# Patient Record
Sex: Female | Born: 1960
Health system: Southern US, Community
[De-identification: ages and names within clinical notes are randomized; demographics above are authoritative.]

## PROBLEM LIST (undated history)

## (undated) DIAGNOSIS — K635 Polyp of colon: Secondary | ICD-10-CM

## (undated) DIAGNOSIS — E785 Hyperlipidemia, unspecified: Secondary | ICD-10-CM

## (undated) DIAGNOSIS — I10 Essential (primary) hypertension: Secondary | ICD-10-CM

## (undated) DIAGNOSIS — D689 Coagulation defect, unspecified: Secondary | ICD-10-CM

## (undated) DIAGNOSIS — R011 Cardiac murmur, unspecified: Secondary | ICD-10-CM

## (undated) DIAGNOSIS — Z86718 Personal history of other venous thrombosis and embolism: Secondary | ICD-10-CM

## (undated) HISTORY — DX: Hyperlipidemia, unspecified: E78.5

## (undated) HISTORY — PX: COLONOSCOPY W/ POLYPECTOMY: SHX1380

## (undated) HISTORY — DX: Essential (primary) hypertension: I10

## (undated) HISTORY — DX: Polyp of colon: K63.5

## (undated) HISTORY — DX: Cardiac murmur, unspecified: R01.1

## (undated) HISTORY — DX: Coagulation defect, unspecified: D68.9

## (undated) HISTORY — DX: Personal history of other venous thrombosis and embolism: Z86.718

## (undated) HISTORY — PX: NO PAST SURGERIES: SHX2092

---

## 1998-08-29 ENCOUNTER — Other Ambulatory Visit: Admission: RE | Admit: 1998-08-29 | Discharge: 1998-08-29 | Payer: Self-pay | Admitting: Obstetrics and Gynecology

## 1999-01-12 ENCOUNTER — Encounter: Payer: Self-pay | Admitting: Emergency Medicine

## 1999-01-12 ENCOUNTER — Emergency Department (HOSPITAL_COMMUNITY): Admission: EM | Admit: 1999-01-12 | Discharge: 1999-01-12 | Payer: Self-pay | Admitting: Emergency Medicine

## 1999-10-07 ENCOUNTER — Other Ambulatory Visit: Admission: RE | Admit: 1999-10-07 | Discharge: 1999-10-07 | Payer: Self-pay | Admitting: Obstetrics and Gynecology

## 2000-12-13 ENCOUNTER — Other Ambulatory Visit: Admission: RE | Admit: 2000-12-13 | Discharge: 2000-12-13 | Payer: Self-pay | Admitting: Obstetrics and Gynecology

## 2001-12-18 ENCOUNTER — Other Ambulatory Visit: Admission: RE | Admit: 2001-12-18 | Discharge: 2001-12-18 | Payer: Self-pay | Admitting: Obstetrics and Gynecology

## 2003-01-03 ENCOUNTER — Other Ambulatory Visit: Admission: RE | Admit: 2003-01-03 | Discharge: 2003-01-03 | Payer: Self-pay | Admitting: Obstetrics and Gynecology

## 2004-03-30 ENCOUNTER — Other Ambulatory Visit: Admission: RE | Admit: 2004-03-30 | Discharge: 2004-03-30 | Payer: Self-pay | Admitting: Obstetrics and Gynecology

## 2005-04-27 ENCOUNTER — Ambulatory Visit: Payer: Self-pay | Admitting: Gastroenterology

## 2005-04-28 ENCOUNTER — Other Ambulatory Visit: Admission: RE | Admit: 2005-04-28 | Discharge: 2005-04-28 | Payer: Self-pay | Admitting: Obstetrics and Gynecology

## 2005-05-11 ENCOUNTER — Ambulatory Visit: Payer: Self-pay | Admitting: Gastroenterology

## 2010-03-18 ENCOUNTER — Encounter (INDEPENDENT_AMBULATORY_CARE_PROVIDER_SITE_OTHER): Payer: Self-pay | Admitting: *Deleted

## 2010-07-19 DIAGNOSIS — Z86718 Personal history of other venous thrombosis and embolism: Secondary | ICD-10-CM

## 2010-07-19 HISTORY — DX: Personal history of other venous thrombosis and embolism: Z86.718

## 2010-08-20 NOTE — Letter (Signed)
Summary: Colonoscopy Letter   Gastroenterology  [address] [place], [place] [address]   Phone: [phone]  Fax: [phone]      March 18, 2010 MRN: [id number]   [facility] [address] [place], [place]  [address]   Dear Ms. Padia,   According to your medical record, it is time for you to schedule a Colonoscopy. The American Cancer Society recommends this procedure as a method to detect early colon cancer. Patients with a family history of colon cancer, or a personal history of colon polyps or inflammatory bowel disease are at increased risk.  This letter has beeen generated based on the recommendations made at the time of your procedure. If you feel that in your particular situation this may no longer apply, please contact our office.  Please call our office at [phone] to schedule this appointment or to update your records at your earliest convenience.  Thank you for cooperating with [place] to provide you with the very best care possible.   Sincerely,  Judie Petit T. Russella Dar, M.D.  [facility] Gastroenterology Division [phone]

## 2010-09-02 ENCOUNTER — Encounter (INDEPENDENT_AMBULATORY_CARE_PROVIDER_SITE_OTHER): Payer: Self-pay | Admitting: *Deleted

## 2010-09-09 NOTE — Letter (Signed)
Summary: Pre Visit Letter Revised  [facility]  [address] [place], [place] [address]   Phone: [phone]  Fax: [phone]        09/02/2010 MRN: [id number] [facility] [address] [place], [place]  [address]             Procedure Date:  10-13-10   Welcome to the Gastroenterology Division at [facility].    You are scheduled to see a nurse for your pre-procedure visit on 09-29-10 at 4:30P.M. on the 3rd floor at [facility] [place], [address]  We ask that you try to arrive at our office 15 minutes prior to your appointment time to allow for check-in.  Please take a minute to review the attached form.  If you answer "Yes" to one or more of the questions on the first page, we ask that you call the person listed at your earliest opportunity.  If you answer "No" to all of the questions, please complete the rest of the form and bring it to your appointment.    Your nurse visit will consist of discussing your medical and surgical history, your immediate family medical history, and your medications.   If you are unable to list all of your medications on the form, please bring the medication bottles to your appointment and we will list them.  We will need to be aware of both prescribed and over the counter drugs.  We will need to know exact dosage information as well.    Please be prepared to read and sign documents such as consent forms, a financial agreement, and acknowledgement forms.  If necessary, and with your consent, a friend or relative is welcome to sit-in on the nurse visit with you.  Please bring your insurance card so that we may make a copy of it.  If your insurance requires a referral to see a specialist, please bring your referral form from your primary care physician.  No co-pay is required for this nurse visit.     If you cannot keep your appointment, please call [phone] to cancel or reschedule prior to your appointment date.  This  allows [place] the opportunity to schedule an appointment for another patient in need of care.    Thank you for choosing [facility] for your medical needs.  We appreciate the opportunity to care for you.  Please visit [place] at our website  to learn more about our practice.  Sincerely, The Gastroenterology Division

## 2010-09-29 ENCOUNTER — Encounter: Payer: Self-pay | Admitting: Gastroenterology

## 2010-10-06 NOTE — Letter (Signed)
Summary: [facility] Instructions  [facility]  [address] [place], [place] [address]   Phone: [phone]  Fax: [phone]       Renee Swanson    11/25/1960    MRN: [id number]        Procedure Day Dorna Bloom:  Renee Swanson  10/13/10     Arrival Time:  10:30AM     Procedure Time:  11:30AM     Location of Procedure:                    _X_  [facility] (4th Floor)                      PREPARATION FOR COLONOSCOPY WITH MOVIPREP   Starting 5 days prior to your procedure 10/08/10 do not eat nuts, seeds, popcorn, corn, beans, peas,  salads, or any raw vegetables.  Do not take any fiber supplements (e.g. Metamucil, Citrucel, and Benefiber).  THE DAY BEFORE YOUR PROCEDURE         DATE: 10/12/10   DAY: MONDAY  1.  Drink clear liquids the entire day-NO SOLID FOOD  2.  Do not drink anything colored red or purple.  Avoid juices with pulp.  No orange juice.  3.  Drink at least 64 oz. (8 glasses) of fluid/clear liquids during the day to prevent dehydration and help the prep work efficiently.  CLEAR LIQUIDS INCLUDE: Water Jello Ice Popsicles Tea (sugar ok, no milk/cream) Powdered fruit flavored drinks Coffee (sugar ok, no milk/cream) Gatorade Juice: apple, white grape, white cranberry  Lemonade Clear bullion, consomm, broth Carbonated beverages (any kind) Strained chicken noodle soup Hard Candy                             4.  In the morning, mix first dose of MoviPrep solution:    Empty 1 Pouch A and 1 Pouch B into the disposable container    Add lukewarm drinking water to the top line of the container. Mix to dissolve    Refrigerate (mixed solution should be used within 24 hrs)  5.  Begin drinking the prep at 5:00 p.m. The MoviPrep container is divided by 4 marks.   Every 15 minutes drink the solution down to the next mark (approximately 8 oz) until the full liter is complete.   6.  Follow completed prep with 16 oz of clear liquid of your choice (Nothing  red or purple).  Continue to drink clear liquids until bedtime.  7.  Before going to bed, mix second dose of MoviPrep solution:    Empty 1 Pouch A and 1 Pouch B into the disposable container    Add lukewarm drinking water to the top line of the container. Mix to dissolve    Refrigerate  THE DAY OF YOUR PROCEDURE      DATE: 10/13/10   DAY: MONDAY  Beginning at 6:30AM (5 hours before procedure):         1. Every 15 minutes, drink the solution down to the next mark (approx 8 oz) until the full liter is complete.  2. Follow completed prep with 16 oz. of clear liquid of your choice.    3. You may drink clear liquids until 9:30AM (2 HOURS BEFORE PROCEDURE).   MEDICATION INSTRUCTIONS  Unless otherwise instructed, you should take regular prescription medications with a small sip of water   as early as possible the morning of  your procedure.         OTHER INSTRUCTIONS  You will need a responsible adult at least 50 years of age to accompany you and drive you home.   This person must remain in the waiting room during your procedure.  Wear loose fitting clothing that is easily removed.  Leave jewelry and other valuables at home.  However, you may wish to bring a book to read or  an iPod/MP3 player to listen to music as you wait for your procedure to start.  Remove all body piercing jewelry and leave at home.  Total time from sign-in until discharge is approximately 2-3 hours.  You should go home directly after your procedure and rest.  You can resume normal activities the  day after your procedure.  The day of your procedure you should not:   Drive   Make legal decisions   Operate machinery   Drink alcohol   Return to work  You will receive specific instructions about eating, activities and medications before you leave.    The above instructions have been reviewed and explained to me by   Renee Rias RN  September 29, 2010 4:56 PM     I fully understand and can  verbalize these instructions _____________________________ Date _________

## 2010-10-06 NOTE — Miscellaneous (Signed)
Summary: Lec previsit  Clinical Lists Changes  Medications: Added new medication of MOVIPREP 100 GM  SOLR (PEG-KCL-NACL-NASULF-NA ASC-C) As per prep instructions. - Signed Rx of MOVIPREP 100 GM  SOLR (PEG-KCL-NACL-NASULF-NA ASC-C) As per prep instructions.;  #1 x 0;  Signed;  Entered by: Ulis Rias RN;  Authorized by: Meryl Dare MD [place];  Method used: Electronically to [facility] [phone]*, [address], [place], [place], [place]  [address], Ph: [phone] or [phone], Fax: [phone] Observations: Added new observation of NKA: T (09/29/2010 16:18)    Prescriptions: MOVIPREP 100 GM  SOLR (PEG-KCL-NACL-NASULF-NA ASC-C) As per prep instructions.  #1 x 0   Entered by:   Ulis Rias RN   Authorized by:   Meryl Dare MD [facility]   Signed by:   Ulis Rias RN on 09/29/2010   Method used:   Electronically to        [facility] [phone]* (retail)       [address]       [place], [place]  [address]       Ph: [phone] or [phone]       Fax: [phone]   RxID:   [id number]

## 2010-10-12 ENCOUNTER — Encounter: Payer: Self-pay | Admitting: Gastroenterology

## 2010-10-13 ENCOUNTER — Encounter: Payer: Self-pay | Admitting: Gastroenterology

## 2010-10-13 ENCOUNTER — Ambulatory Visit (AMBULATORY_SURGERY_CENTER): Payer: BC Managed Care – PPO | Admitting: Gastroenterology

## 2010-10-13 VITALS — BP 146/69 | HR 81 | Temp 99.2°F | Resp 18 | Ht 65.0 in | Wt 217.0 lb

## 2010-10-13 DIAGNOSIS — Z800 Family history of malignant neoplasm of digestive organs: Secondary | ICD-10-CM

## 2010-10-13 DIAGNOSIS — Z86010 Personal history of colonic polyps: Secondary | ICD-10-CM

## 2010-10-13 DIAGNOSIS — Z1211 Encounter for screening for malignant neoplasm of colon: Secondary | ICD-10-CM

## 2010-10-13 DIAGNOSIS — D126 Benign neoplasm of colon, unspecified: Secondary | ICD-10-CM

## 2010-10-13 DIAGNOSIS — K635 Polyp of colon: Secondary | ICD-10-CM

## 2010-10-13 NOTE — Patient Instructions (Signed)
Polyps  HOLD Aspirin and ALL Aspirin containing and anti inflammatory products for 2 weeks (until 10-27-10) Await Pathology Results Repeat Exam in 3 years (2015)

## 2010-10-13 NOTE — Progress Notes (Signed)
Pt stated before sedation she was difficult to sedate

## 2010-10-14 ENCOUNTER — Telehealth: Payer: Self-pay | Admitting: *Deleted

## 2010-10-14 NOTE — Telephone Encounter (Signed)
See callback note.

## 2010-10-19 ENCOUNTER — Encounter: Payer: Self-pay | Admitting: Gastroenterology

## 2010-10-20 NOTE — Procedures (Signed)
Summary: Colonoscopy  Patient: Renee Swanson Note: All result statuses are Final unless otherwise noted.  Tests: (1) Colonoscopy (COL)   COL Colonoscopy           DONE     [facility]     [address]     [place], [place]  [address]          COLONOSCOPY PROCEDURE REPORT     PATIENT:  Renee, Swanson  MR#:  [id number]     BIRTHDATE:  [birth year], 49 yrs. old  GENDER:  female     ENDOSCOPIST:  Judie Petit T. Russella Dar, MD, [facility]          PROCEDURE DATE:  10/13/2010     PROCEDURE:  Colonoscopy with snare polypectomy     ASA CLASS:  Class II     INDICATIONS:  1) surveillance and high-risk screening  2) family     history of colon cancer: father age 51.  3) history of     pre-cancerous (adenomatous) colon polyps: 04/2003.     MEDICATIONS:   Fentanyl 100 mcg IV, Versed 12 mg IV     DESCRIPTION OF PROCEDURE:   After the risks benefits and     alternatives of the procedure were thoroughly explained, informed     consent was obtained.  Digital rectal exam was performed and     revealed no abnormalities.   The LB [id number] endoscope     was introduced through the anus and advanced to the cecum, which     was identified by both the appendix and ileocecal valve, without     limitations.  The quality of the prep was excellent, using     MoviPrep.  The instrument was then slowly withdrawn as the colon     was fully examined.     <<PROCEDUREIMAGES>>     FINDINGS:  A sessile polyp was found in the proximal transverse     colon. It was 5 mm in size. Polyp was snared without cautery.     Retrieval was successful. A sessile polyp was found in the distal     transverse colon. It was 10 mm in size. Polyp was snared, then     cauterized with monopolar cautery. Retrieval was successful.     Piecemeal polypectomy. A normal appearing cecum, ileocecal valve,     and appendiceal orifice were identified. The ascending, hepatic     flexure, splenic flexure, descending, sigmoid colon, and  rectum     appeared unremarkable. Retroflexed views in the rectum revealed no     abnormalities.  The time to cecum =  1  minutes. The scope was     then withdrawn (time =  15  min) from the patient and the     procedure completed.          COMPLICATIONS:  None          ENDOSCOPIC IMPRESSION:     1) 5 mm sessile polyp in the proximal transverse colon     2) 10 mm sessile polyp in the distal transverse colon          RECOMMENDATIONS:     1) Hold aspirin, aspirin products, and anti-inflammatory     medication for 2 weeks.     2) Await pathology results     3) Repeat Colonoscopy in 3 years pending pathology review.          Venita Lick. Russella Dar, MD, [facility]  CC:  Richardean Chimera, MD          n.     Rosalie DoctorJudie Petit T. Deslyn Cavenaugh at 10/13/2010 11:45 AM          [place]erson, Jasmine December, [id number]  Note: An exclamation mark (!) indicates a result that was not dispersed into the flowsheet. Document Creation Date: 10/13/2010 11:45 AM _______________________________________________________________________  (1) Order result status: Final Collection or observation date-time: 10/13/2010 11:38 Requested date-time:  Receipt date-time:  Reported date-time:  Referring Physician:   Ordering Physician: Claudette Head [id number]) Specimen Source:  Source: [facility] Order Number: [id number] Lab site:

## 2011-02-01 ENCOUNTER — Encounter (HOSPITAL_BASED_OUTPATIENT_CLINIC_OR_DEPARTMENT_OTHER): Payer: Self-pay | Admitting: *Deleted

## 2011-02-01 ENCOUNTER — Emergency Department (HOSPITAL_BASED_OUTPATIENT_CLINIC_OR_DEPARTMENT_OTHER)
Admission: EM | Admit: 2011-02-01 | Discharge: 2011-02-01 | Disposition: A | Discharge status: Home / Self Care | Payer: BC Managed Care – PPO | Attending: Emergency Medicine | Admitting: Emergency Medicine

## 2011-02-01 ENCOUNTER — Emergency Department (HOSPITAL_BASED_OUTPATIENT_CLINIC_OR_DEPARTMENT_OTHER): Payer: BC Managed Care – PPO

## 2011-02-01 ENCOUNTER — Emergency Department (INDEPENDENT_AMBULATORY_CARE_PROVIDER_SITE_OTHER): Payer: BC Managed Care – PPO

## 2011-02-01 DIAGNOSIS — I824Y9 Acute embolism and thrombosis of unspecified deep veins of unspecified proximal lower extremity: Secondary | ICD-10-CM

## 2011-02-01 DIAGNOSIS — I82409 Acute embolism and thrombosis of unspecified deep veins of unspecified lower extremity: Secondary | ICD-10-CM

## 2011-02-01 DIAGNOSIS — R609 Edema, unspecified: Secondary | ICD-10-CM | POA: Insufficient documentation

## 2011-02-01 DIAGNOSIS — I824Z9 Acute embolism and thrombosis of unspecified deep veins of unspecified distal lower extremity: Secondary | ICD-10-CM | POA: Insufficient documentation

## 2011-02-01 DIAGNOSIS — M79609 Pain in unspecified limb: Secondary | ICD-10-CM | POA: Insufficient documentation

## 2011-02-01 DIAGNOSIS — M7989 Other specified soft tissue disorders: Secondary | ICD-10-CM

## 2011-02-01 LAB — CBC
MCV: 87.8 fL (ref 78.0–100.0)
Platelets: 205 10*3/uL (ref 150–400)
RDW: 12.3 % (ref 11.5–15.5)
WBC: 10.3 10*3/uL (ref 4.0–10.5)

## 2011-02-01 LAB — PROTIME-INR: INR: 1.02 (ref 0.00–1.49)

## 2011-02-01 MED ORDER — WARFARIN SODIUM 5 MG PO TABS: 5.0000 mg | ORAL_TABLET | Freq: Every day | ORAL | Status: DC

## 2011-02-01 MED ORDER — ENOXAPARIN SODIUM 150 MG/ML ~~LOC~~ SOLN: 100.0000 mg | Freq: Two times a day (BID) | SUBCUTANEOUS | Status: AC

## 2011-02-01 MED ORDER — ENOXAPARIN SODIUM 100 MG/ML ~~LOC~~ SOLN
1.0000 mg/kg | Freq: Once | SUBCUTANEOUS | Status: AC
  Administered 2011-02-01: 100 mg via SUBCUTANEOUS
  Filled 2011-02-01: qty 1

## 2011-02-01 NOTE — ED Notes (Signed)
Pt c/o right leg cramping x 3 days. Sent here from PMD office for eval of ? DVT

## 2011-02-01 NOTE — ED Notes (Signed)
Pt educated on importance of f/u with an established PMD to have levels checked and to check for resolve of blood clot. Pt verbalized understanding. Pt also demonstrated correct technique of adminstering the lovenox injection, and states she feels comfortable doing so at home. Aware to f/u for worsening symptoms.

## 2011-02-01 NOTE — ED Provider Notes (Signed)
History     Chief Complaint  Patient presents with  . Leg Pain   HPI Comments: Patient presents from urgent care with complaint of right lower extremity swelling. She states that this started on Friday, she awoke with these symptoms, and it is gradually worsened. The symptoms are currently moderate, improved slightly when she brings her legs above her heart, and is not associated with chest pain, shortness of breath or fevers. She does use oral contraceptive pills, but has no other risk factors for DVT including no travel, immobilization, trauma, cancer. Was referred to the emergency department for ultrasound window urgent care evaluated her. The patient has a mild ache in this leg but no rashes.  Patient is a 50 y.o. female presenting with leg pain. The history is provided by the patient.  Leg Pain  Pertinent negatives include no numbness.    Past Medical History  Diagnosis Date  . Colon polyps     history of colon polyps    History reviewed. No pertinent past surgical history.  Family History  Problem Relation Age of Onset  . Colon cancer Father     History  Substance Use Topics  . Smoking status: Never Smoker   . Smokeless tobacco: Not on file  . Alcohol Use: Not on file    OB History    Grav Para Term Preterm Abortions TAB SAB Ect Mult Living                  Review of Systems  Constitutional: Negative for fever and chills.  HENT: Negative for sore throat and neck pain.   Eyes: Negative for visual disturbance.  Respiratory: Negative for cough and shortness of breath.   Cardiovascular: Negative for chest pain.  Gastrointestinal: Negative for nausea, vomiting, abdominal pain and diarrhea.  Genitourinary: Negative for dysuria and frequency.  Musculoskeletal: Negative for back pain.       Edema  Skin: Negative for rash.  Neurological: Negative for weakness, numbness and headaches.  Hematological: Negative for adenopathy.  Psychiatric/Behavioral: Negative for  behavioral problems.    Physical Exam  BP 143/74  Pulse 81  Temp 98.5 F (36.9 C)  Resp 16  Wt 210 lb (95.255 kg)  SpO2 100%  LMP 01/18/2011  Physical Exam  Constitutional: She appears well-developed and well-nourished. No distress.  HENT:  Head: Normocephalic and atraumatic.  Mouth/Throat: Oropharynx is clear and moist. No oropharyngeal exudate.  Eyes: Conjunctivae and EOM are normal. Pupils are equal, round, and reactive to light. Right eye exhibits no discharge. Left eye exhibits no discharge. No scleral icterus.  Neck: Normal range of motion. Neck supple. No JVD present. No thyromegaly present.  Cardiovascular: Normal rate, regular rhythm, normal heart sounds and intact distal pulses.  Exam reveals no gallop and no friction rub.   No murmur heard. Pulmonary/Chest: Effort normal and breath sounds normal. No respiratory distress. She has no wheezes. She has no rales.  Abdominal: Soft. Bowel sounds are normal. She exhibits no distension and no mass. There is no tenderness.  Musculoskeletal: Normal range of motion. She exhibits edema. She exhibits no tenderness.       Right lower extremity much larger than left lower extremity below the knee. Positive edema with pitting. Minimal edema of the foot, normal pulses of the feet bilaterally, normal sensation.  Lymphadenopathy:    She has no cervical adenopathy.  Neurological: She is alert. Coordination normal.  Skin: Skin is warm and dry. No rash noted. She is not diaphoretic. No  erythema.  Psychiatric: She has a normal mood and affect. Her behavior is normal.    ED Course  Procedures  MDM Patient has asymmetry of the legs and is on oral contraceptive pills consistent with possible DVT. Ultrasound ordered.  Ultrasound positive for deep venous thrombosis. Lovenox given in the emergency department. Will discharge with Lovenox and Coumadin for the next 5 days with close followup with her family doctor. All patients and families  questions have been answered    Vida Roller, MD 02/01/11 2231

## 2011-02-01 NOTE — ED Notes (Signed)
Pt reports swelling/pain to RLE x3 days. Was seen at [facility] today and was advised to f/u at a facility to have a doppler study. Dorsiflexion does not seem to worsen pain. Able to ambulate. No obvious injury, but does not have some swelling, most noticeably to ankle.

## 2011-02-02 ENCOUNTER — Telehealth: Payer: Self-pay | Admitting: Cardiology

## 2011-02-02 NOTE — Telephone Encounter (Signed)
Pt wanted to know if SN is accepting new pts. I advised he is not. She wanted to know recs for a PCP. I transferred the pt to [facility] Primary care. Carron Curie, CMA

## 2011-02-02 NOTE — Telephone Encounter (Signed)
NOTE: BLOOD CLOT IS IN LEG. SPOUSE [name]- DOB [birth year] CAN BE REACHED AT [phone]. Renee Swanson

## 2013-08-10 ENCOUNTER — Encounter: Payer: Self-pay | Admitting: Gastroenterology

## 2013-12-28 ENCOUNTER — Encounter: Payer: Self-pay | Admitting: Gastroenterology

## 2014-02-07 ENCOUNTER — Ambulatory Visit (AMBULATORY_SURGERY_CENTER): Payer: Self-pay | Admitting: *Deleted

## 2014-02-07 VITALS — Ht 65.0 in | Wt 234.2 lb

## 2014-02-07 DIAGNOSIS — Z860100 Personal history of colon polyps, unspecified: Secondary | ICD-10-CM

## 2014-02-07 MED ORDER — MOVIPREP 100 G PO SOLR: ORAL | Status: DC

## 2014-02-07 NOTE — Progress Notes (Signed)
No allergies to eggs or soy. No problems with sedation; never had general anesthesia.  Pt given Emmi instructions for colonoscopy  No oxygen use  No diet drug use

## 2014-02-21 ENCOUNTER — Encounter: Payer: BC Managed Care – PPO | Admitting: Gastroenterology

## 2014-02-22 ENCOUNTER — Ambulatory Visit (AMBULATORY_SURGERY_CENTER): Payer: No Typology Code available for payment source | Admitting: Gastroenterology

## 2014-02-22 ENCOUNTER — Encounter: Payer: Self-pay | Admitting: Gastroenterology

## 2014-02-22 VITALS — BP 130/78 | HR 66 | Temp 98.5°F | Resp 25 | Ht 65.0 in | Wt 234.0 lb

## 2014-02-22 DIAGNOSIS — Z86010 Personal history of colonic polyps: Secondary | ICD-10-CM

## 2014-02-22 MED ORDER — SODIUM CHLORIDE 0.9 % IV SOLN: 500.0000 mL | INTRAVENOUS | Status: DC

## 2014-02-22 NOTE — Patient Instructions (Signed)
YOU HAD AN ENDOSCOPIC PROCEDURE TODAY AT THE [facility]: Refer to the procedure report that was given to you for any specific questions about what was found during the examination.  If the procedure report does not answer your questions, please call your gastroenterologist to clarify.  If you requested that your care partner not be given the details of your procedure findings, then the procedure report has been included in a sealed envelope for you to review at your convenience later.  YOU SHOULD EXPECT: Some feelings of bloating in the abdomen. Passage of more gas than usual.  Walking can help get rid of the air that was put into your GI tract during the procedure and reduce the bloating. If you had a lower endoscopy (such as a colonoscopy or flexible sigmoidoscopy) you may notice spotting of blood in your stool or on the toilet paper. If you underwent a bowel prep for your procedure, then you may not have a normal bowel movement for a few days.  DIET: Your first meal following the procedure should be a light meal and then it is ok to progress to your normal diet.  A half-sandwich or bowl of soup is an example of a good first meal.  Heavy or fried foods are harder to digest and may make you feel nauseous or bloated.  Likewise meals heavy in dairy and vegetables can cause extra gas to form and this can also increase the bloating.  Drink plenty of fluids but you should avoid alcoholic beverages for 24 hours.  ACTIVITY: Your care partner should take you home directly after the procedure.  You should plan to take it easy, moving slowly for the rest of the day.  You can resume normal activity the day after the procedure however you should NOT DRIVE or use heavy machinery for 24 hours (because of the sedation medicines used during the test).    SYMPTOMS TO REPORT IMMEDIATELY: A gastroenterologist can be reached at any hour.  During normal business hours, 8:30 AM to 5:00 PM Monday through Friday,  call [phone].  After hours and on weekends, please call the GI answering service at [phone] who will take a message and have the physician on call contact you.   Following lower endoscopy (colonoscopy or flexible sigmoidoscopy):  Excessive amounts of blood in the stool  Significant tenderness or worsening of abdominal pains  Swelling of the abdomen that is new, acute  Fever of 100F or higher    FOLLOW UP: If any biopsies were taken you will be contacted by phone or by letter within the next 1-3 weeks.  Call your gastroenterologist if you have not heard about the biopsies in 3 weeks.  Our staff will call the home number listed on your records the next business day following your procedure to check on you and address any questions or concerns that you may have at that time regarding the information given to you following your procedure. This is a courtesy call and so if there is no answer at the home number and we have not heard from you through the emergency physician on call, we will assume that you have returned to your regular daily activities without incident.  SIGNATURES/CONFIDENTIALITY: You and/or your care partner have signed paperwork which will be entered into your electronic medical record.  These signatures attest to the fact that that the information above on your After Visit Summary has been reviewed and is understood.  Full responsibility of the confidentiality   of this discharge information lies with you and/or your care-partner.   INFORMATION ON HEMORRHOIDS GIVEN TO YOU TODAY

## 2014-02-22 NOTE — Op Note (Signed)
[place]  [address] [place], [id number]   COLONOSCOPY PROCEDURE REPORT  PATIENT: Renee Swanson, Renee Swanson  MR#: [id number] BIRTHDATE: 10-Jul-1961 , 52  yrs. old GENDER: Female ENDOSCOPIST: Ladene Artist, MD, [facility] PROCEDURE DATE:  02/22/2014 PROCEDURE:   Colonoscopy, surveillance First Screening Colonoscopy - Avg.  risk and is 50 yrs.  old or older - No.  Prior Negative Screening - Now for repeat screening. N/A  History of Adenoma - Now for follow-up colonoscopy & has been > or = to 3 yrs.  Yes hx of adenoma.  Has been 3 or more years since last colonoscopy.  Polyps Removed Today? No.  Recommend repeat exam, <10 yrs? Yes.  High risk (family or personal hx). ASA CLASS:   Class II INDICATIONS:Patient's personal history of tubulovillous adenomatous colon polyps. MEDICATIONS: MAC sedation, administered by CRNA and propofol (Diprivan) 300mg  IV DESCRIPTION OF PROCEDURE:   After the risks benefits and alternatives of the procedure were thoroughly explained, informed consent was obtained.  A digital rectal exam revealed no abnormalities of the rectum.   The LB [id number]  endoscope was introduced through the anus and advanced to the cecum, which was identified by both the appendix and ileocecal valve. No adverse events experienced.   The quality of the prep was excellent, using MoviPrep  The instrument was then slowly withdrawn as the colon was fully examined.  COLON FINDINGS: A normal appearing cecum, ileocecal valve, and appendiceal orifice were identified.  The ascending, hepatic flexure, transverse, splenic flexure, descending, sigmoid colon and rectum appeared unremarkable.  No polyps or cancers were seen. Retroflexed views revealed small internal hemorrhoids. The time to cecum=0 minutes 24 seconds.  Withdrawal time=9 minutes 00 seconds. The scope was withdrawn and the procedure completed.  COMPLICATIONS: There were no complications.  ENDOSCOPIC  IMPRESSION: 1.  Normal colon 2.  Small internal hemorrhoids  RECOMMENDATIONS: 1.  Repeat Colonoscopy in 3 years.  eSigned:  Ladene Artist, MD, [facility] 02/22/2014 8:41 AM   cc: Foye Deer, MD

## 2014-02-25 ENCOUNTER — Telehealth: Payer: Self-pay | Admitting: *Deleted

## 2014-02-25 NOTE — Telephone Encounter (Signed)
  Follow up Call-  Call back number 02/22/2014  Post procedure Call Back phone  # [phone]  Permission to leave phone message No     Patient questions:  Do you have a fever, pain , or abdominal swelling? No. Pain Score  0 *  Have you tolerated food without any problems? Yes.    Have you been able to return to your normal activities? Yes.    Do you have any questions about your discharge instructions: Diet   No. Medications  No. Follow up visit  No.  Do you have questions or concerns about your Care? No.  Actions: * If pain score is 4 or above: No action needed, pain <4.

## 2014-05-20 ENCOUNTER — Other Ambulatory Visit: Payer: Self-pay | Admitting: Obstetrics and Gynecology

## 2014-05-21 LAB — CYTOLOGY - PAP

## 2015-01-08 ENCOUNTER — Encounter: Payer: Self-pay | Admitting: Gastroenterology

## 2017-01-04 ENCOUNTER — Encounter: Payer: Self-pay | Admitting: Gastroenterology

## 2017-02-08 ENCOUNTER — Encounter: Payer: Self-pay | Admitting: Gastroenterology

## 2017-04-06 ENCOUNTER — Encounter: Payer: No Typology Code available for payment source | Admitting: Gastroenterology

## 2017-04-14 ENCOUNTER — Ambulatory Visit (AMBULATORY_SURGERY_CENTER): Payer: Self-pay

## 2017-04-14 VITALS — Ht 65.0 in | Wt 238.8 lb

## 2017-04-14 DIAGNOSIS — Z860100 Personal history of colon polyps, unspecified: Secondary | ICD-10-CM

## 2017-04-14 MED ORDER — SUPREP BOWEL PREP KIT 17.5-3.13-1.6 GM/177ML PO SOLN: 1.0000 | Freq: Once | ORAL | 0 refills | Status: AC

## 2017-04-14 NOTE — Progress Notes (Signed)
No diet meds No home oxygen No past problems with anesthesia No allergies to eggs or soy  Declined emmi 

## 2017-04-15 ENCOUNTER — Encounter: Payer: Self-pay | Admitting: Gastroenterology

## 2017-04-28 ENCOUNTER — Ambulatory Visit (AMBULATORY_SURGERY_CENTER): Payer: 59 | Admitting: Gastroenterology

## 2017-04-28 ENCOUNTER — Encounter: Payer: Self-pay | Admitting: Gastroenterology

## 2017-04-28 VITALS — BP 110/72 | HR 61 | Temp 98.4°F | Resp 11 | Ht 65.0 in | Wt 238.0 lb

## 2017-04-28 DIAGNOSIS — Z86010 Personal history of colonic polyps: Secondary | ICD-10-CM

## 2017-04-28 MED ORDER — SODIUM CHLORIDE 0.9 % IV SOLN: 500.0000 mL | INTRAVENOUS | Status: DC

## 2017-04-28 NOTE — Patient Instructions (Signed)
YOU HAD AN ENDOSCOPIC PROCEDURE TODAY AT [facility]:   Refer to the procedure report that was given to you for any specific questions about what was found during the examination.  If the procedure report does not answer your questions, please call your gastroenterologist to clarify.  If you requested that your care partner not be given the details of your procedure findings, then the procedure report has been included in a sealed envelope for you to review at your convenience later.  YOU SHOULD EXPECT: Some feelings of bloating in the abdomen. Passage of more gas than usual.  Walking can help get rid of the air that was put into your GI tract during the procedure and reduce the bloating. If you had a lower endoscopy (such as a colonoscopy or flexible sigmoidoscopy) you may notice spotting of blood in your stool or on the toilet paper. If you underwent a bowel prep for your procedure, you may not have a normal bowel movement for a few days.  Please Note:  You might notice some irritation and congestion in your nose or some drainage.  This is from the oxygen used during your procedure.  There is no need for concern and it should clear up in a day or so.  SYMPTOMS TO REPORT IMMEDIATELY:   Following lower endoscopy (colonoscopy or flexible sigmoidoscopy):  Excessive amounts of blood in the stool  Significant tenderness or worsening of abdominal pains  Swelling of the abdomen that is new, acute  Fever of 100F or higher   For urgent or emergent issues, a gastroenterologist can be reached at any hour by calling [phone].   DIET:  We do recommend a small meal at first, but then you may proceed to your regular diet.  Drink plenty of fluids but you should avoid alcoholic beverages for 24 hours.  ACTIVITY:  You should plan to take it easy for the rest of today and you should NOT DRIVE or use heavy machinery until tomorrow (because of the sedation medicines used during the test).     FOLLOW UP: Our staff will call the number listed on your records the next business day following your procedure to check on you and address any questions or concerns that you may have regarding the information given to you following your procedure. If we do not reach you, we will leave a message.  However, if you are feeling well and you are not experiencing any problems, there is no need to return our call.  We will assume that you have returned to your regular daily activities without incident.  If any biopsies were taken you will be contacted by phone or by letter within the next 1-3 weeks.  Please call us at [phone] if you have not heard about the biopsies in 3 weeks.    SIGNATURES/CONFIDENTIALITY: You and/or your care partner have signed paperwork which will be entered into your electronic medical record.  These signatures attest to the fact that that the information above on your After Visit Summary has been reviewed and is understood.  Full responsibility of the confidentiality of this discharge information lies with you and/or your care-partner.  Recall colonoscopy in 5 years.

## 2017-04-28 NOTE — Op Note (Signed)
[facility] Patient Name: Renee Swanson Procedure Date: 04/28/2017 9:09 AM MRN: [id number] Endoscopist: Ladene Artist , MD Age: 56 Referring MD:  Date of Birth: 09/24/1960 Gender: Female Account #: [id number] Procedure:                Colonoscopy Indications:              Surveillance: Personal history of adenomatous                            polyps on last colonoscopy 3 years ago Medicines:                Monitored Anesthesia Care Procedure:                Pre-Anesthesia Assessment:                           - Prior to the procedure, a History and Physical                            was performed, and patient medications and                            allergies were reviewed. The patient's tolerance of                            previous anesthesia was also reviewed. The risks                            and benefits of the procedure and the sedation                            options and risks were discussed with the patient.                            All questions were answered, and informed consent                            was obtained. Prior Anticoagulants: The patient has                            taken no previous anticoagulant or antiplatelet                            agents. ASA Grade Assessment: II - A patient with                            mild systemic disease. After reviewing the risks                            and benefits, the patient was deemed in                            satisfactory condition to undergo the procedure.  After obtaining informed consent, the colonoscope                            was passed under direct vision. Throughout the                            procedure, the patient's blood pressure, pulse, and                            oxygen saturations were monitored continuously. The                            Colonoscope was introduced through the anus and                            advanced to the the  cecum, identified by                            appendiceal orifice and ileocecal valve. The                            ileocecal valve, appendiceal orifice, and rectum                            were photographed. The quality of the bowel                            preparation was excellent. The colonoscopy was                            performed without difficulty. The patient tolerated                            the procedure well. Scope In: 9:12:41 AM Scope Out: 9:24:31 AM Scope Withdrawal Time: 0 hours 10 minutes 39 seconds  Total Procedure Duration: 0 hours 11 minutes 50 seconds  Findings:                 The perianal and digital rectal examinations were                            normal.                           Internal hemorrhoids were found during                            retroflexion. The hemorrhoids were small and Grade                            I (internal hemorrhoids that do not prolapse).                           The exam was otherwise without abnormality on  direct and retroflexion views. Complications:            No immediate complications. Estimated blood loss:                            None. Estimated Blood Loss:     Estimated blood loss: none. Impression:               - Internal hemorrhoids.                           - The examination was otherwise normal on direct                            and retroflexion views.                           - No specimens collected. Recommendation:           - Repeat colonoscopy in 5 years for surveillance.                           - Patient has a contact number available for                            emergencies. The signs and symptoms of potential                            delayed complications were discussed with the                            patient. Return to normal activities tomorrow.                            Written discharge instructions were provided to the                             patient.                           - Resume previous diet.                           - Continue present medications. Ladene Artist, MD 04/28/2017 9:33:19 AM This report has been signed electronically.

## 2017-04-28 NOTE — Progress Notes (Signed)
Report given to PACU, vss 

## 2017-04-28 NOTE — Progress Notes (Signed)
Pt's states no medical or surgical changes since previsit or office visit. 

## 2017-04-29 ENCOUNTER — Telehealth: Payer: Self-pay | Admitting: *Deleted

## 2017-04-29 NOTE — Telephone Encounter (Signed)
  Follow up Call-  Call back number 04/28/2017  Post procedure Call Back phone  # [phone]  Permission to leave phone message Yes  Some recent data might be hidden     Patient questions:  Do you have a fever, pain , or abdominal swelling? No. Pain Score  0 *  Have you tolerated food without any problems? Yes.    Have you been able to return to your normal activities? Yes.    Do you have any questions about your discharge instructions: Diet   No. Medications  No. Follow up visit  No.  Do you have questions or concerns about your Care? No.  Actions: * If pain score is 4 or above: No action needed, pain <4.

## 2019-01-02 ENCOUNTER — Other Ambulatory Visit: Payer: Self-pay

## 2019-01-02 ENCOUNTER — Emergency Department (HOSPITAL_BASED_OUTPATIENT_CLINIC_OR_DEPARTMENT_OTHER)
Admission: EM | Admit: 2019-01-02 | Discharge: 2019-01-02 | Disposition: A | Discharge status: Home / Self Care | Payer: 59 | Attending: Emergency Medicine | Admitting: Emergency Medicine

## 2019-01-02 ENCOUNTER — Encounter (HOSPITAL_BASED_OUTPATIENT_CLINIC_OR_DEPARTMENT_OTHER): Payer: Self-pay | Admitting: *Deleted

## 2019-01-02 ENCOUNTER — Emergency Department (HOSPITAL_BASED_OUTPATIENT_CLINIC_OR_DEPARTMENT_OTHER): Payer: 59

## 2019-01-02 DIAGNOSIS — M79605 Pain in left leg: Secondary | ICD-10-CM

## 2019-01-02 DIAGNOSIS — Z86718 Personal history of other venous thrombosis and embolism: Secondary | ICD-10-CM | POA: Insufficient documentation

## 2019-01-02 LAB — CBC WITH DIFFERENTIAL/PLATELET
Abs Immature Granulocytes: 0.01 10*3/uL (ref 0.00–0.07)
Basophils Absolute: 0 10*3/uL (ref 0.0–0.1)
Basophils Relative: 1 %
Eosinophils Absolute: 0.3 10*3/uL (ref 0.0–0.5)
Eosinophils Relative: 3 %
HCT: 41.6 % (ref 36.0–46.0)
Hemoglobin: 13.8 g/dL (ref 12.0–15.0)
Immature Granulocytes: 0 %
Lymphocytes Relative: 42 %
Lymphs Abs: 3.4 10*3/uL (ref 0.7–4.0)
MCH: 29.7 pg (ref 26.0–34.0)
MCHC: 33.2 g/dL (ref 30.0–36.0)
MCV: 89.5 fL (ref 80.0–100.0)
Monocytes Absolute: 0.6 10*3/uL (ref 0.1–1.0)
Monocytes Relative: 7 %
Neutro Abs: 3.9 10*3/uL (ref 1.7–7.7)
Neutrophils Relative %: 47 %
Platelets: 236 10*3/uL (ref 150–400)
RBC: 4.65 MIL/uL (ref 3.87–5.11)
RDW: 12.3 % (ref 11.5–15.5)
WBC: 8.2 10*3/uL (ref 4.0–10.5)
nRBC: 0 % (ref 0.0–0.2)

## 2019-01-02 LAB — BASIC METABOLIC PANEL
Anion gap: 11 (ref 5–15)
BUN: 13 mg/dL (ref 6–20)
CO2: 25 mmol/L (ref 22–32)
Calcium: 9.2 mg/dL (ref 8.9–10.3)
Chloride: 104 mmol/L (ref 98–111)
Creatinine, Ser: 0.68 mg/dL (ref 0.44–1.00)
GFR calc Af Amer: >60 mL/min (ref >60)
GFR calc non Af Amer: >60 mL/min (ref >60)
Glucose, Bld: 99 mg/dL (ref 70–99)
Potassium: 3.4 mmol/L — ABNORMAL LOW (ref 3.5–5.1)
Sodium: 140 mmol/L (ref 135–145)

## 2019-01-02 MED ORDER — MELOXICAM 7.5 MG PO TABS
7.5000 mg | ORAL_TABLET | Freq: Once | ORAL | Status: AC
  Administered 2019-01-02: 7.5 mg via ORAL
  Filled 2019-01-02: qty 1

## 2019-01-02 MED ORDER — ENOXAPARIN SODIUM 100 MG/ML ~~LOC~~ SOLN
1.0000 mg/kg | Freq: Once | SUBCUTANEOUS | Status: AC
  Administered 2019-01-02: 18:00:00 100 mg via SUBCUTANEOUS
  Filled 2019-01-02: qty 1

## 2019-01-02 MED ORDER — MELOXICAM 7.5 MG PO TABS: 7.5000 mg | ORAL_TABLET | Freq: Every day | ORAL | 0 refills | Status: DC

## 2019-01-02 NOTE — Discharge Instructions (Addendum)
As we discussed, your ultrasound did show extensive arthritis noted in the knee.  This may be contributing to your pain.  Follow the RICE (Rest, Ice, Compression, Elevation) protocol as directed.   Take Mobic as directed.   As we discussed, you have an ultrasound scheduled tomorrow morning.  Please return to main entry of the [facility] for obtaining her ultrasound.  Return the emergency department for any worsening pain, redness or swelling of the leg, breathing, numbness/weakness, vomiting blood, blood in stools,  or any other worsening or concerning symptoms.

## 2019-01-02 NOTE — ED Triage Notes (Signed)
Pt. Reports she has L knee pain just behind the Knee that started on Sunday and has gotten progressively worse and now the entire L knee and around the knee and behind the knee hurts really bad.  Pt. Reports she can hardly stand to put weight on her leg.

## 2019-01-02 NOTE — ED Notes (Signed)
ED Provider at bedside. 

## 2019-01-02 NOTE — ED Provider Notes (Signed)
[facility] EMER[facility]ENCY DEPARTMENT Provider Note   CSN: [id number] Arrival date & time: 01/02/19  1517    History   Chief Complaint Chief Complaint  Patient presents with  . Knee Pain  . Leg Pain    HPI Renee Swanson is a 58 y.o. 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  She reports no preceding trauma, injury or fall.  She does state that prior to onset of symptoms, she had been working in her garden and had been doing some strenuous activity.  She does not recall twisting the knee at all.  Patient states that she has been taking OTC medication with minimal improvement in pain.  She states that pain is worse when she tries to ambulate and bear weight.  She states that it is improved by resting.  She has not noted any overlying warmth, erythema, edema.  Patient has not had any fevers.  She states that she had a history of a DVT in 2012 that was believed to be caused by OCPs.  She is not currently on birth control pills. She denies any OCP use, recent immobilization, prior history of DVT/PE, recent surgery, leg swelling, or long travel.     The history is provided by the patient.    Past Medical History:  Diagnosis Date  . Clotting disorder ([place])    changed BCP and it caused blood clots  . Colon polyps    history of colon polyps  . H/O blood clots 2012   leg  . Heart murmur     There are no active problems to display for this patient.   Past Surgical History:  Procedure Laterality Date  . COLONOSCOPY W/ POLYPECTOMY    . NO PAST SURGERIES       OB History   No obstetric history on file.      Home Medications    Prior to Admission medications   Medication Sig Start Date End Date Taking? Authorizing Provider  aspirin 81 MG tablet Take 81 mg by mouth daily.   Yes [provider]  Cholecalciferol (VITAMIN D PO) Take 1 capsule by mouth daily. 1000 units   Yes [provider]   Cyanocobalamin (VITAMIN B12 PO) Take 1,500 mcg by mouth.   Yes [provider]  hydrochlorothiazide (HYDRODIURIL) 25 MG tablet Take 25 mg by mouth daily.   Yes [provider]  meloxicam (MOBIC) 7.5 MG tablet Take 1 tablet (7.5 mg total) by mouth daily. 01/02/19   Volanda Napoleon, PA-C  Multiple Vitamins-Minerals (CENTRUM PO) Take 1 tablet by mouth daily.     [provider]    Family History Family History  Problem Relation Age of Onset  . Colon cancer Father 81    Social History Social History   Tobacco Use  . Smoking status: Never Smoker  . Smokeless tobacco: Never Used  Substance Use Topics  . Alcohol use: No  . Drug use: No     Allergies   Sulfa antibiotics   Review of Systems Review of Systems  Constitutional: Negative for fever.  Cardiovascular: Negative for leg swelling.  Musculoskeletal:       Left knee pain  Skin: Negative for color change.  All other systems reviewed and are negative.    Physical Exam Updated Vital Signs BP 136/71 (BP Location: Right Arm)   Pulse 74   Temp 98 F (36.7 C)   Resp 16  Ht 5\' 5"  (1.651 m)   Wt 102.1 kg   LMP 01/18/2011   SpO2 100%   BMI 37.44 kg/m   Physical Exam Vitals signs and nursing note reviewed.  Constitutional:      Appearance: Normal appearance. She is well-developed.  HENT:     Head: Normocephalic and atraumatic.  Eyes:     General: Lids are normal.     Conjunctiva/sclera: Conjunctivae normal.     Pupils: Pupils are equal, round, and reactive to light.  Neck:     Musculoskeletal: Full passive range of motion without pain.  Cardiovascular:     Rate and Rhythm: Normal rate and regular rhythm.     Pulses: Normal pulses.          Radial pulses are 2+ on the right side and 2+ on the left side.       Dorsalis pedis pulses are 2+ on the right side and 2+ on the left side.     Heart sounds: Normal heart sounds. No murmur. No friction rub. No gallop.   Pulmonary:     Effort:  Pulmonary effort is normal.     Breath sounds: Normal breath sounds.  Musculoskeletal: Normal range of motion.     Comments: Tenderness palpation noted posterior aspect of left knee.  No overlying warmth, erythema, edema.  No deformity or crepitus noted.  No bony tenderness noted to tib-fib, ankle.  Flexion/extension intact.  Negative posterior and anterior drawer test.  No instability noted on varus or valgus stress.  No tenderness palpation noted to left hip.  No tenderness palpation of the right lower extremity.  Skin:    General: Skin is warm and dry.     Capillary Refill: Capillary refill takes less than 2 seconds.     Comments: Good distal cap refill. LLE is not dusky in appearance or cool to touch.  Neurological:     Mental Status: She is alert and oriented to person, place, and time.  Psychiatric:        Speech: Speech normal.      ED Treatments / Results  Labs (all labs ordered are listed, but only abnormal results are displayed) Labs Reviewed  BASIC METABOLIC PANEL - Abnormal; Notable for the following components:      Result Value   Potassium 3.4 (*)    All other components within normal limits  CBC WITH DIFFERENTIAL/PLATELET    EKG None  Radiology Dg Knee Complete 4 Views Left  Result Date: 01/02/2019 CLINICAL DATA:  Leg pain.  Sudden onset 3 days ago after yard work. EXAM: LEFT KNEE - COMPLETE 4+ VIEW COMPARISON:  None. FINDINGS: No evidence of fracture, dislocation, or joint effusion. There is moderate to marked tricompartment osteoarthritis with joint space narrowing, marginal spur formation and sharpening of the tibial spines. Soft tissues are unremarkable. IMPRESSION: 1. No acute findings. 2. Moderate to marked tricompartment osteoarthritis. Electronically Signed   By: Kerby Moors M.D.   On: 01/02/2019 16:56    Procedures Procedures (including critical care time)  Medications Ordered in ED Medications  enoxaparin (LOVENOX) injection 100 mg (100 mg  Subcutaneous Given 01/02/19 1806)  meloxicam (MOBIC) tablet 7.5 mg (7.5 mg Oral Given 01/02/19 1805)     Initial Impression / Assessment and Plan / ED Course  I have reviewed the triage vital signs and the nursing notes.  Pertinent labs & imaging results that were available during my care of the patient were reviewed by me and considered in my medical decision  making (see chart for details).        57 year old female who presents for evaluation of left knee pain x4 days.  No known trauma, injury, fall.  She does report that she was working in the garden prior to onset of symptoms.  Has not noted any overlying warmth, erythema, edema.  Does state that she has had worsening pain with attempts to ambulate.  She denies any fever, overlying warmth, erythema, edema.  She does have history of DVT in 2012 that they were concerned was caused by OCPs.  She is not currently on any oral birth control pills.  No other PE risk factors.  Given her history, concern for DVT versus musculoskeletal injury vs baker's cyst.  History/physical exam not concerning for acute arterial embolism, septic arthritis, ischemic leg.  Plan for x-ray and ultrasound.  X-ray reviewed.  There is marked tricompartmental arthritis with spurring noted.  Discussed results with patient.  I discussed that this could be contributing to her pain, particularly given history of using and doing strenuous work several days before onset of symptoms.  Additionally, given her history of DVT in the legs, I discussed with her my concern for obtaining ultrasound.  Unfortunately at this time we do not have ultrasound capability at this facility.  We will plan to check basic labs, give her dose of Lovenox and schedule her for an ultrasound appointment tomorrow.  Patient agreeable to plan.  CBC with no leukocytosis or anemia.  BMP is unremarkable.  Discussed results with patient.  Will plan to put patient in a knee sleeve and discharged home on Mobic.   Instructed patient to return tomorrow for her ultrasound for evaluation of possible DVT. At this time, patient exhibits no emergent life-threatening condition that require further evaluation in ED or admission. Patient had ample opportunity for questions and discussion. All patient's questions were answered with full understanding. Strict return precautions discussed. Patient expresses understanding and agreement to plan.   Portions of this note were generated with [employer]. Dictation errors may occur despite best attempts at proofreading.  Final Clinical Impressions(s) / ED Diagnoses   Final diagnoses:  Left leg pain    ED Discharge Orders         Ordered    US Venous Img Lower Unilateral Left     01/02/19 1711    meloxicam (MOBIC) 7.5 MG tablet  Daily     01/02/19 1800           Desma Mcgregor 01/02/19 2052    Virgel Manifold, MD 01/03/19 [phone]

## 2019-01-03 ENCOUNTER — Ambulatory Visit (HOSPITAL_BASED_OUTPATIENT_CLINIC_OR_DEPARTMENT_OTHER)
Admission: RE | Admit: 2019-01-03 | Discharge: 2019-01-03 | Disposition: A | Discharge status: Home / Self Care | Payer: 59 | Source: Ambulatory Visit | Attending: Emergency Medicine | Admitting: Emergency Medicine

## 2019-01-03 DIAGNOSIS — M79605 Pain in left leg: Secondary | ICD-10-CM | POA: Insufficient documentation

## 2019-12-03 IMAGING — DX LEFT KNEE - COMPLETE 4+ VIEW
4 series · 4 of 4 positions shown · non-contrast
Comparison: None.

CLINICAL DATA: Leg pain.  Sudden onset 3 days ago after yard work.

EXAM:
LEFT KNEE - COMPLETE 4+ VIEW

[knee ap]
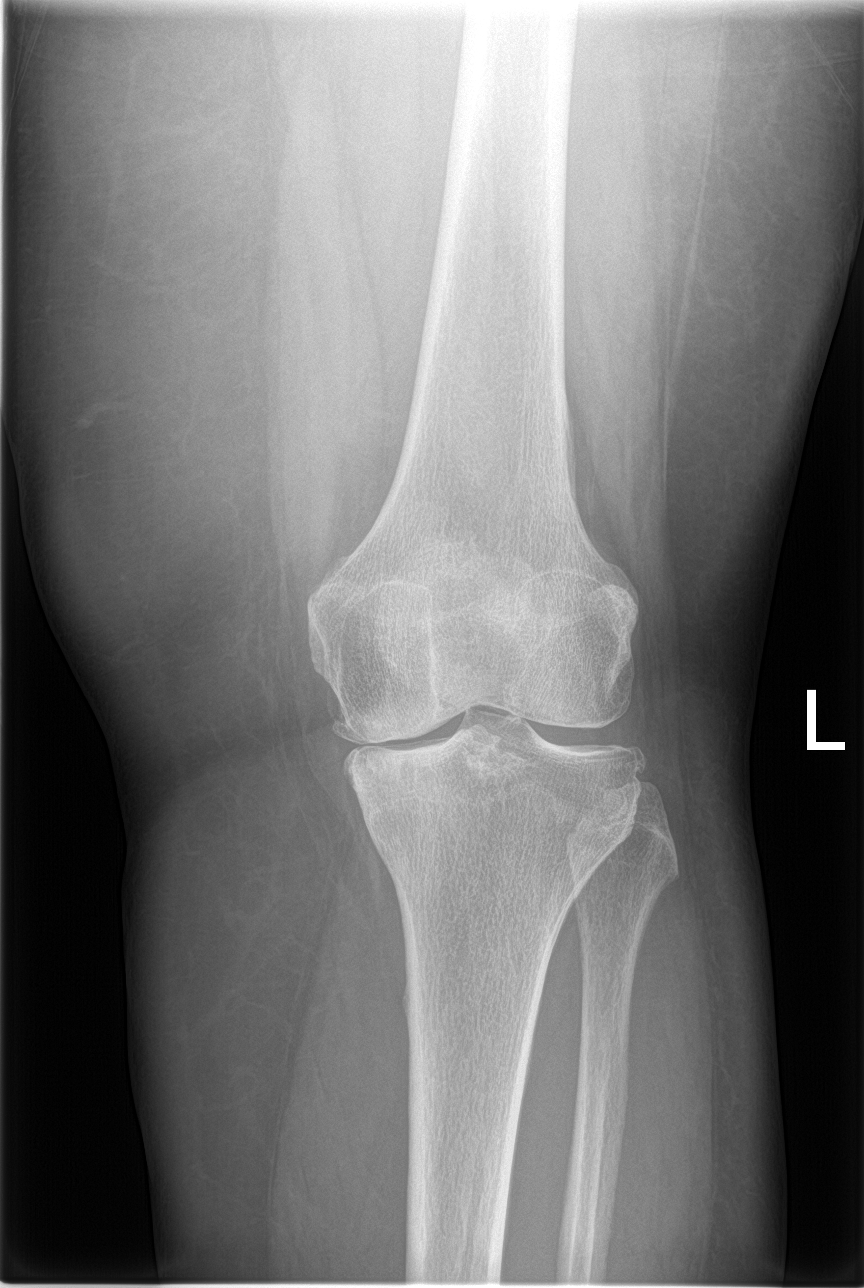

[knee lat]
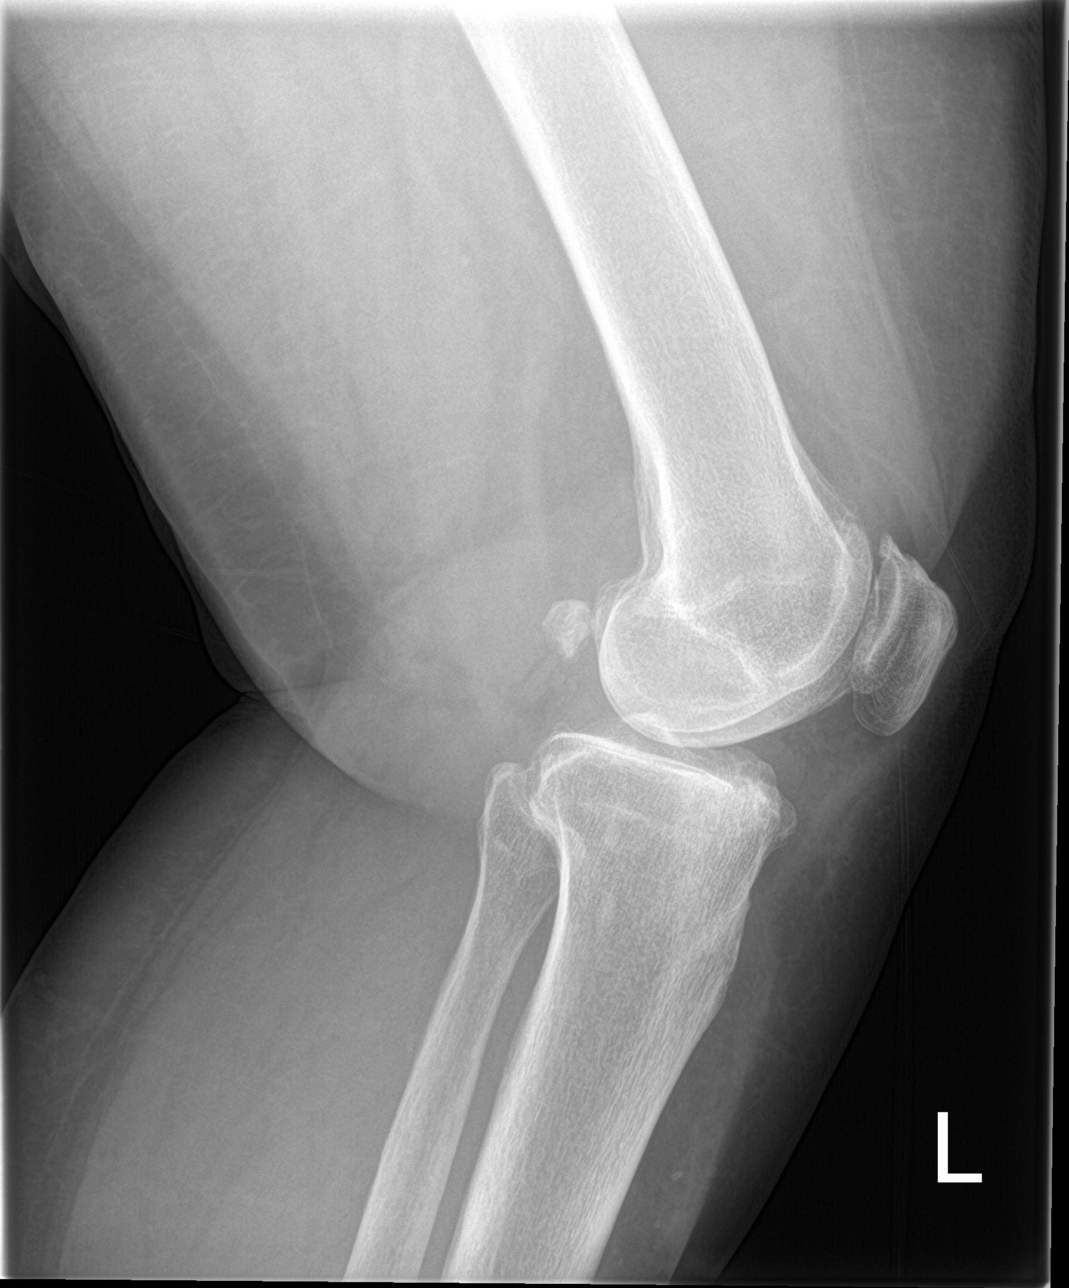

[knee obl (1 of 2)]
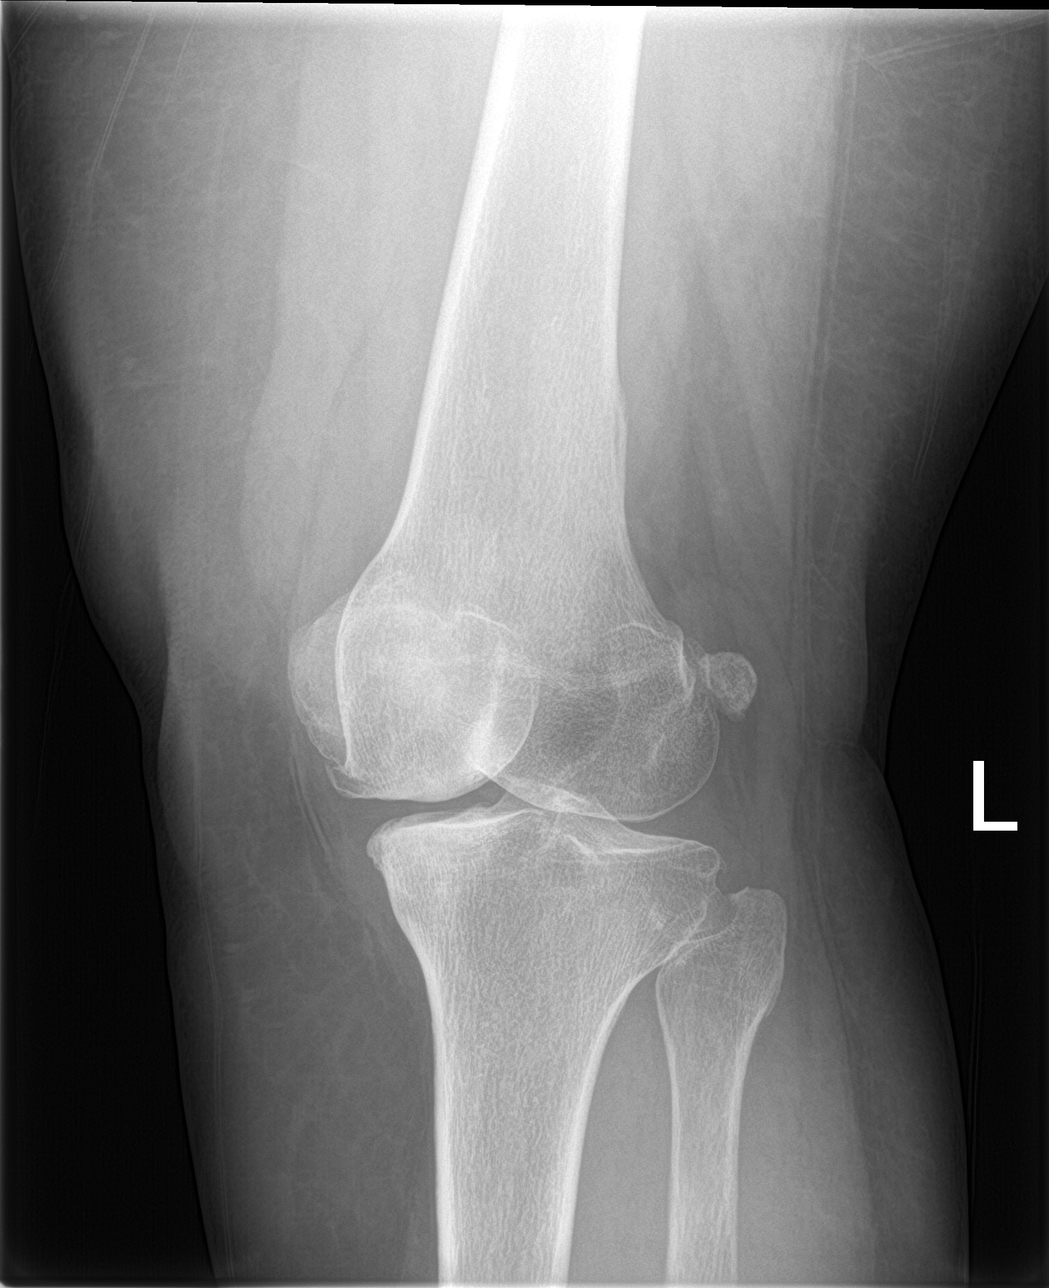

[knee obl (2 of 2)]
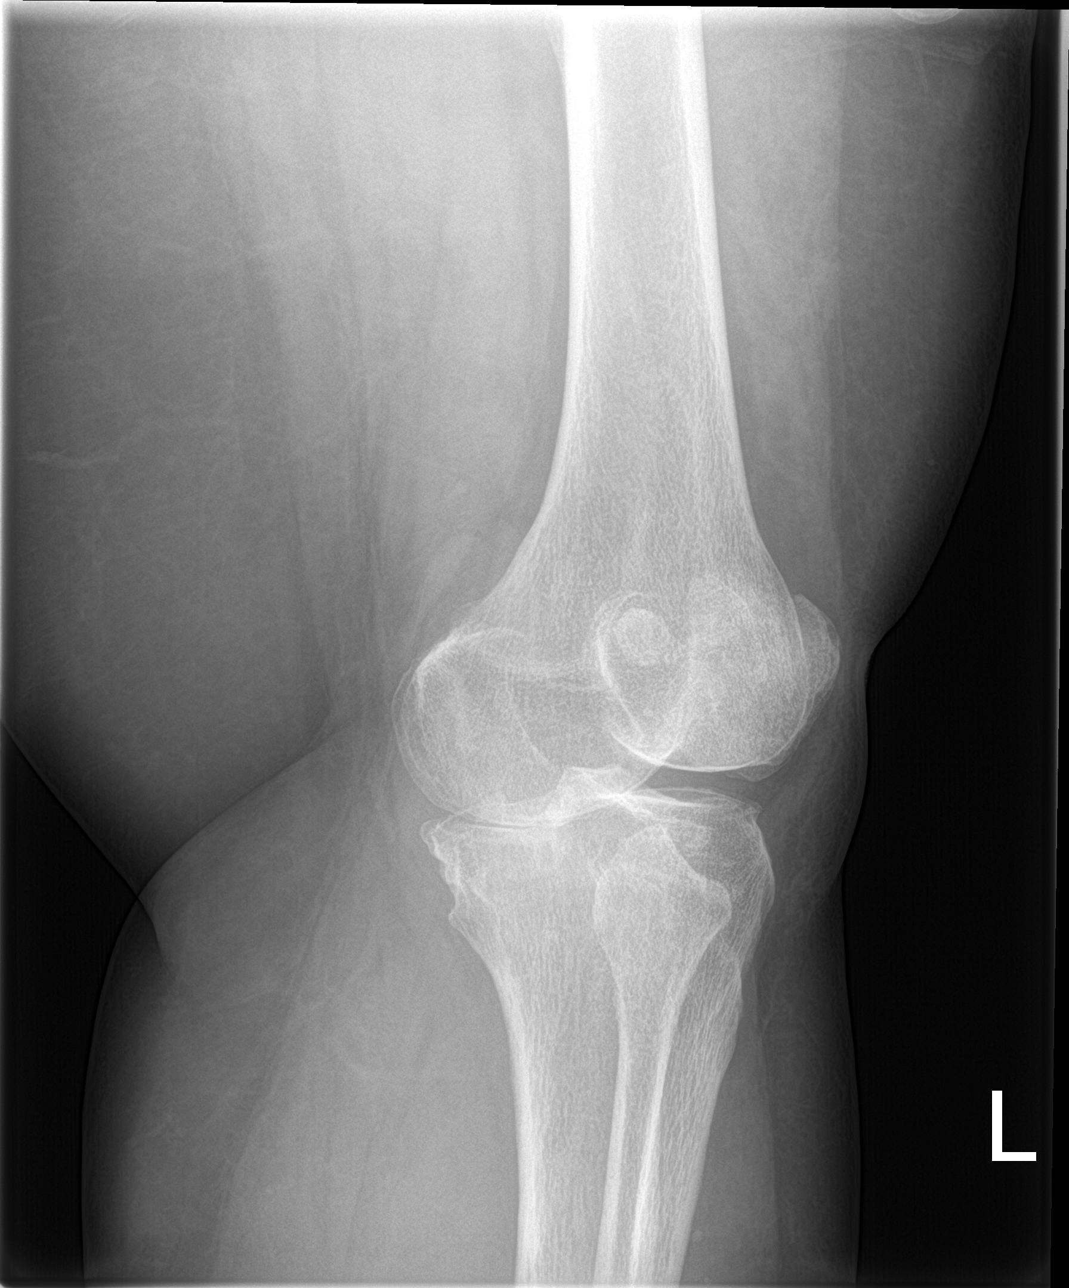

[4 of 4 positions shown; findings below may reference images not displayed]

FINDINGS: No evidence of fracture, dislocation, or joint effusion. There is
moderate to marked tricompartment osteoarthritis with joint space
narrowing, marginal spur formation and sharpening of the tibial
spines. Soft tissues are unremarkable.
IMPRESSION: 1. No acute findings.
2. Moderate to marked tricompartment osteoarthritis.

## 2019-12-04 IMAGING — US VENOUS DOPPLER ULTRASOUND OF LEFT LOWER EXTREMITY
1 series · 13 of 24 positions shown · non-contrast
Comparison: None.

CLINICAL DATA: Left lower extremity pain and edema about the knee



[Series 1: venous doppler ultrasound of left lower extremity · 13 of 36 slices shown]
[im 1/36]
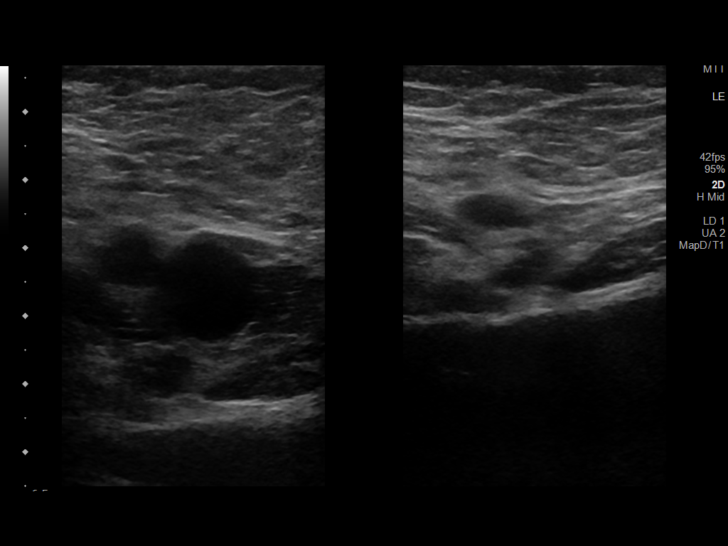
[im 4/36]
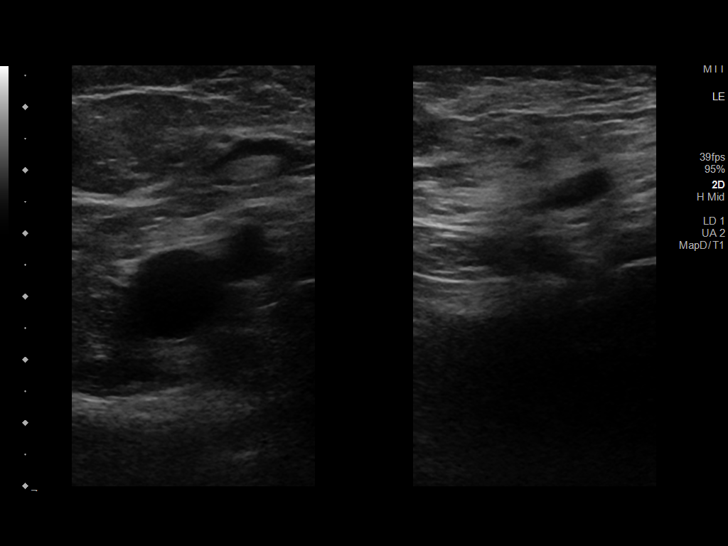
[im 7/36]
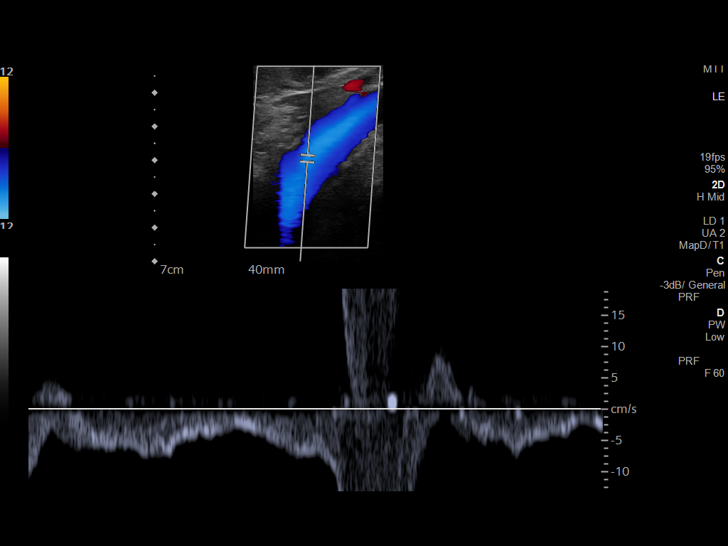
[im 10/36]
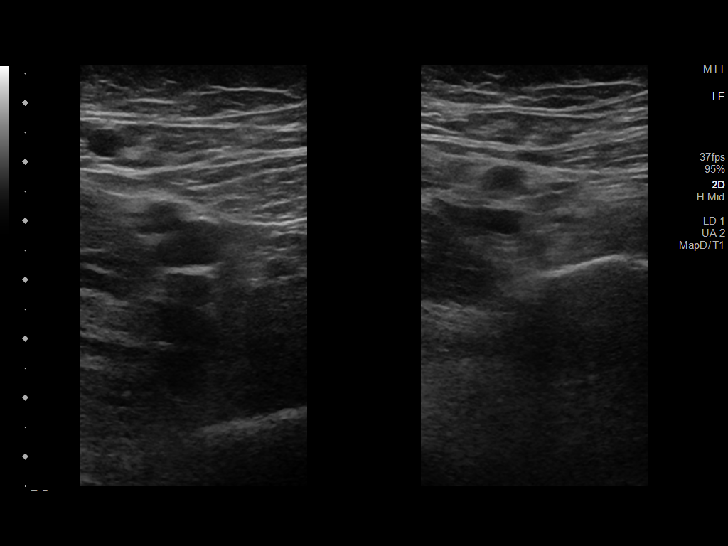
[im 13/36]
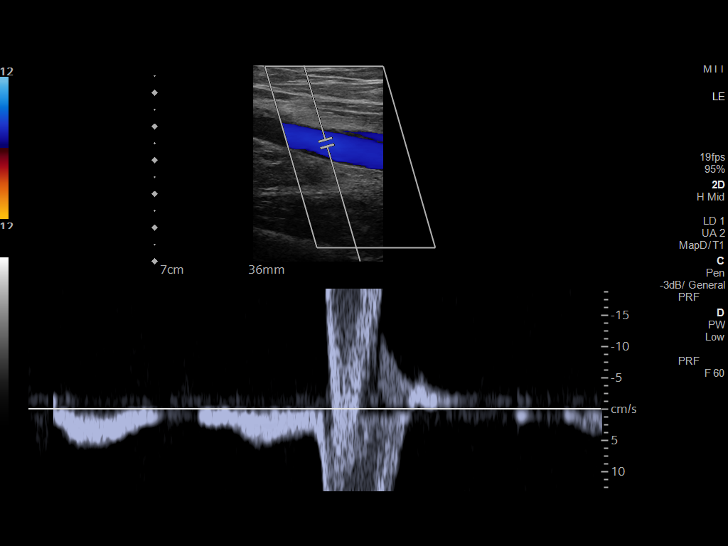
[im 16/36]
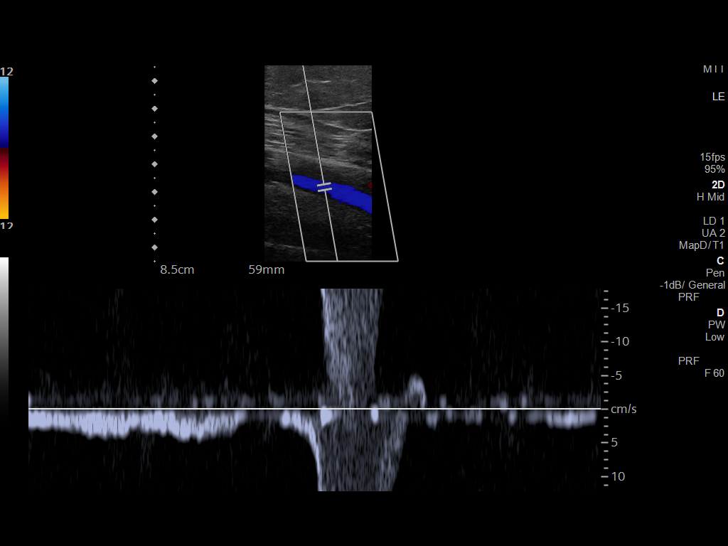
[im 19/36]
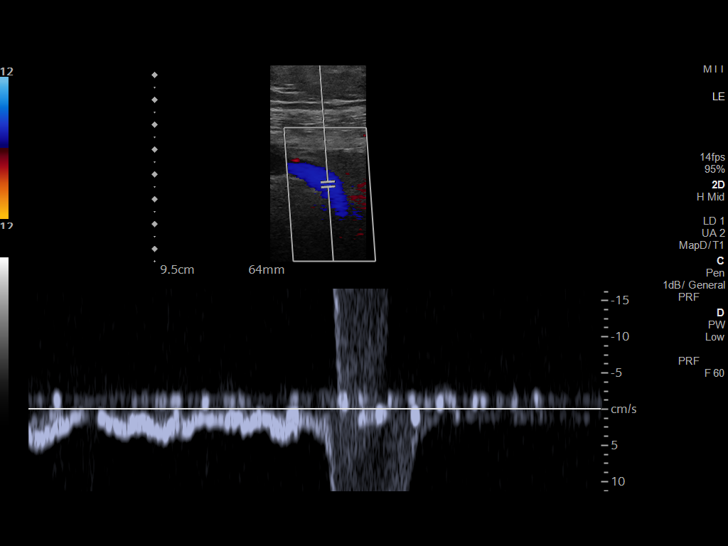
[im 20/36]
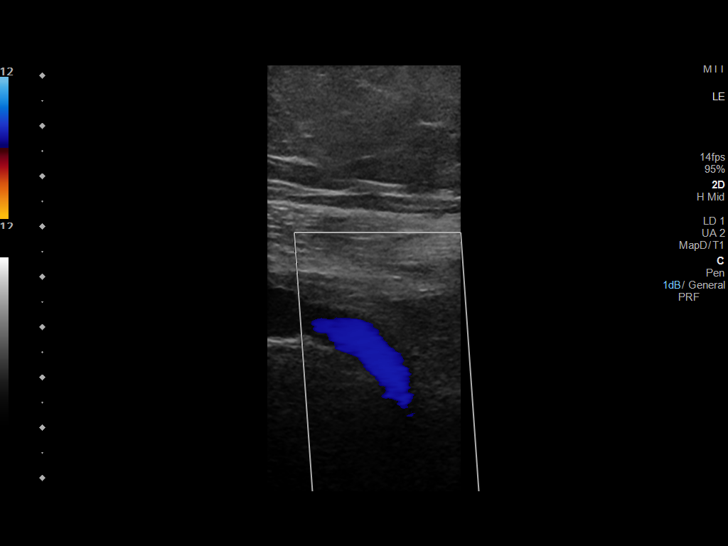
[im 23/36]
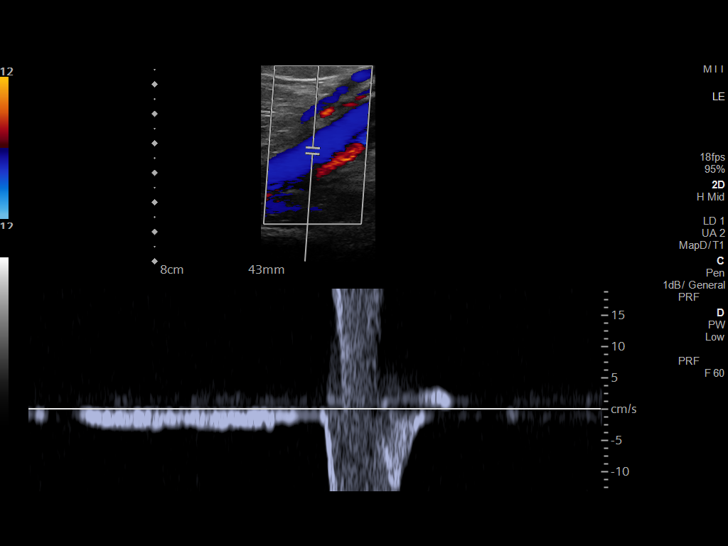
[im 26/36]
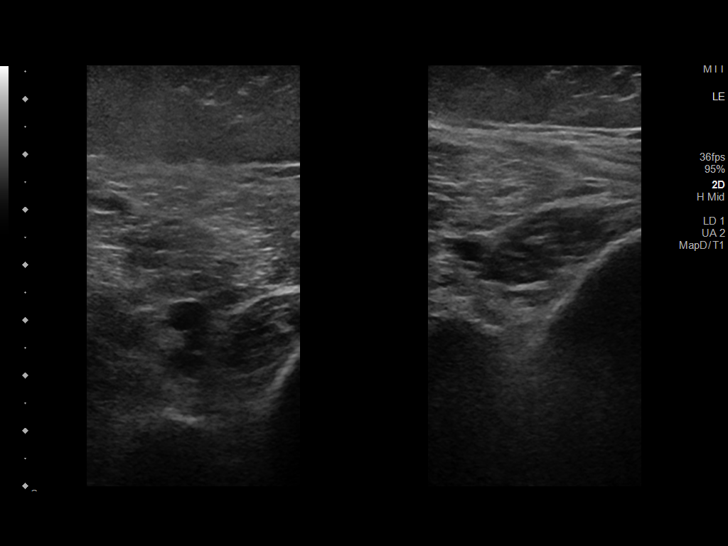
[im 29/36]
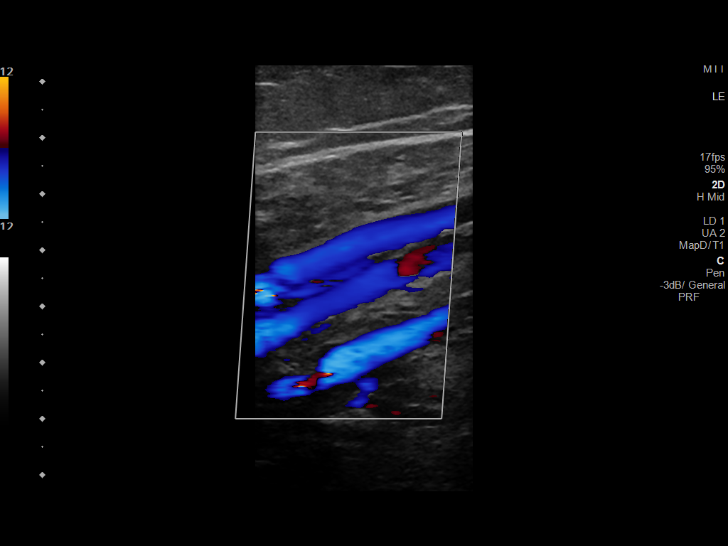
[im 32/36]
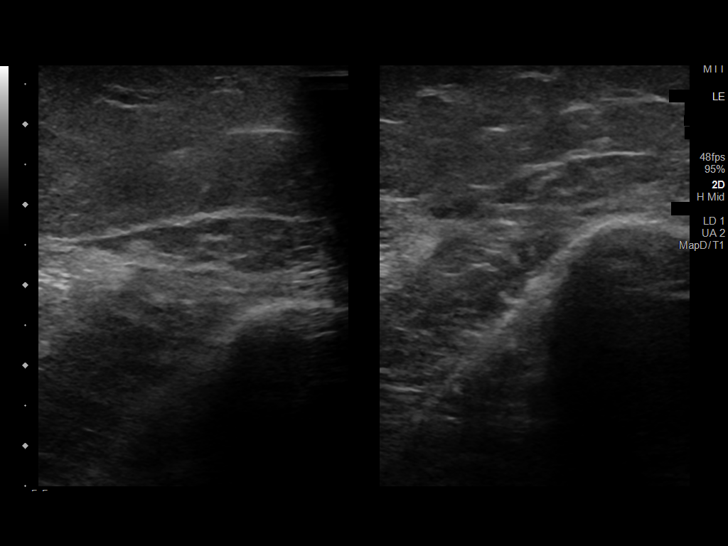
[im 36/36]
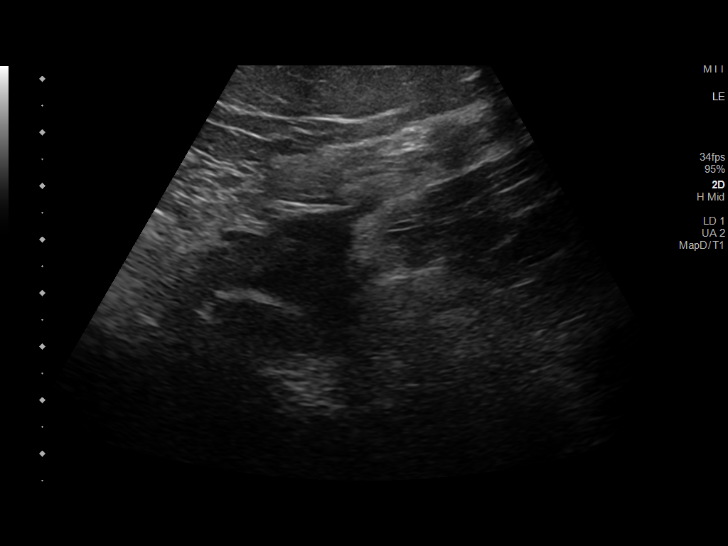

[13 of 24 positions shown; findings below may reference images not displayed]

FINDINGS: Contralateral Common Femoral Vein: Respiratory phasicity is normal
and symmetric with the symptomatic side. No evidence of thrombus.
Normal compressibility.

Common Femoral Vein: No evidence of thrombus. Normal
compressibility, respiratory phasicity and response to augmentation.

Saphenofemoral Junction: No evidence of thrombus. Normal
compressibility and flow on color Doppler imaging.

Profunda Femoral Vein: No evidence of thrombus. Normal
compressibility and flow on color Doppler imaging.

Femoral Vein: No evidence of thrombus. Normal compressibility,
respiratory phasicity and response to augmentation.

Popliteal Vein: No evidence of thrombus. Normal compressibility,
respiratory phasicity and response to augmentation.

Calf Veins: No evidence of thrombus. Normal compressibility and flow
on color Doppler imaging.

Superficial Great Saphenous Vein: No evidence of thrombus. Normal
compressibility.

Venous Reflux:  Not assessed

Other Findings:  None.
IMPRESSION: No evidence of deep venous thrombosis.

## 2020-12-10 ENCOUNTER — Ambulatory Visit: Payer: 59 | Admitting: Emergency Medicine

## 2020-12-10 ENCOUNTER — Encounter: Payer: Self-pay | Admitting: Emergency Medicine

## 2020-12-10 ENCOUNTER — Other Ambulatory Visit: Payer: Self-pay

## 2020-12-10 VITALS — BP 149/88 | HR 77 | Temp 98.2°F | Ht 65.0 in | Wt 226.0 lb

## 2020-12-10 DIAGNOSIS — R7303 Prediabetes: Secondary | ICD-10-CM | POA: Diagnosis not present

## 2020-12-10 DIAGNOSIS — E785 Hyperlipidemia, unspecified: Secondary | ICD-10-CM | POA: Diagnosis not present

## 2020-12-10 DIAGNOSIS — Z7689 Persons encountering health services in other specified circumstances: Secondary | ICD-10-CM

## 2020-12-10 DIAGNOSIS — I10 Essential (primary) hypertension: Secondary | ICD-10-CM

## 2020-12-10 MED ORDER — LOSARTAN POTASSIUM-HCTZ 50-12.5 MG PO TABS: 1.0000 | ORAL_TABLET | Freq: Every day | ORAL | 3 refills | Status: DC

## 2020-12-10 NOTE — Patient Instructions (Addendum)
Dyslipidemia Dyslipidemia is an imbalance of waxy, fat-like substances (lipids) in the blood. The body needs lipids in small amounts. Dyslipidemia often involves a high level of cholesterol or triglycerides, which are types of lipids. Common forms of dyslipidemia include:  High levels of LDL cholesterol. LDL is the type of cholesterol that causes fatty deposits (plaques) to build up in the blood vessels that carry blood away from your heart (arteries).  Low levels of HDL cholesterol. HDL cholesterol is the type of cholesterol that protects against heart disease. High levels of HDL remove the LDL buildup from arteries.  High levels of triglycerides. Triglycerides are a fatty substance in the blood that is linked to a buildup of plaques in the arteries. What are the causes? Primary dyslipidemia is caused by changes (mutations) in genes that are passed down through families (inherited). These mutations cause several types of dyslipidemia. Secondary dyslipidemia is caused by lifestyle choices and diseases that lead to dyslipidemia, such as:  Eating a diet that is high in animal fat.  Not getting enough exercise.  Having diabetes, kidney disease, liver disease, or thyroid disease.  Drinking large amounts of alcohol.  Using certain medicines. What increases the risk? You are more likely to develop this condition if you are an older man or if you are a woman who has gone through menopause. Other risk factors include:  Having a family history of dyslipidemia.  Taking certain medicines, including birth control pills, steroids, some diuretics, and beta-blockers.  Smoking cigarettes.  Eating a high-fat diet.  Having certain medical conditions such as diabetes, polycystic ovary syndrome (PCOS), kidney disease, liver disease, or hypothyroidism.  Not exercising regularly.  Being overweight or obese with too much belly fat. What are the signs or symptoms? In most cases, dyslipidemia does not  usually cause any symptoms. In severe cases, very high lipid levels can cause:  Fatty bumps under the skin (xanthomas).  White or gray ring around the black center (pupil) of the eye. Very high triglyceride levels can cause inflammation of the pancreas (pancreatitis). How is this diagnosed? Your health care provider may diagnose dyslipidemia based on a routine blood test (fasting blood test). Because most people do not have symptoms of the condition, this blood testing (lipid profile) is done on adults age 17 and older and is repeated every 5 years. This test checks:  Total cholesterol. This measures the total amount of cholesterol in your blood, including LDL cholesterol, HDL cholesterol, and triglycerides. A healthy number is below 200.  LDL cholesterol. The target number for LDL cholesterol is different for each person, depending on individual risk factors. Ask your health care provider what your LDL cholesterol should be.  HDL cholesterol. An HDL level of 60 or higher is best because it helps to protect against heart disease. A number below 78 for men or below 18 for women increases the risk for heart disease.  Triglycerides. A healthy triglyceride number is below 150. If your lipid profile is abnormal, your health care provider may do other blood tests.   How is this treated? Treatment depends on the type of dyslipidemia that you have and your other risk factors for heart disease and stroke. Your health care provider will have a target range for your lipid levels based on this information. For many people, this condition may be treated by lifestyle changes, such as diet and exercise. Your health care provider may recommend that you:  Get regular exercise.  Make changes to your diet.  Quit smoking  if you smoke. If diet changes and exercise do not help you reach your goals, your health care provider may also prescribe medicine to lower lipids. The most commonly prescribed type of  medicine lowers your LDL cholesterol (statin drug). If you have a high triglyceride level, your provider may prescribe another type of drug (fibrate) or an omega-3 fish oil supplement, or both. Follow these instructions at home: Eating and drinking  Follow instructions from your health care provider or dietitian about eating or drinking restrictions.  Eat a healthy diet as told by your health care provider. This can help you reach and maintain a healthy weight, lower your LDL cholesterol, and raise your HDL cholesterol. This may include: ? Limiting your calories, if you are overweight. ? Eating more fruits, vegetables, whole grains, fish, and lean meats. ? Limiting saturated fat, trans fat, and cholesterol.  If you drink alcohol: ? Limit how much you use. ? Be aware of how much alcohol is in your drink. In the [place] one drink equals one 12 oz bottle of beer (355 mL), one 5 oz glass of wine (148 mL), or one 1 oz glass of hard liquor (44 mL).  Do not drink alcohol if: ? Your health care provider tells you not to drink. ? You are pregnant, may be pregnant, or are planning to become pregnant. Activity  Get regular exercise. Start an exercise and strength training program as told by your health care provider. Ask your health care provider what activities are safe for you. Your health care provider may recommend: ? 30 minutes of aerobic activity 4-6 days a week. Brisk walking is an example of aerobic activity. ? Strength training 2 days a week. General instructions  Do not use any products that contain nicotine or tobacco, such as cigarettes, e-cigarettes, and chewing tobacco. If you need help quitting, ask your health care provider.  Take over-the-counter and prescription medicines only as told by your health care provider. This includes supplements.  Keep all follow-up visits as told by your health care provider.   Contact a health care provider if:  You are: ? Having trouble sticking  to your exercise or diet plan. ? Struggling to quit smoking or control your use of alcohol. Summary  Dyslipidemia often involves a high level of cholesterol or triglycerides, which are types of lipids.  Treatment depends on the type of dyslipidemia that you have and your other risk factors for heart disease and stroke.  For many people, treatment starts with lifestyle changes, such as diet and exercise.  Your health care provider may prescribe medicine to lower lipids. This information is not intended to replace advice given to you by your health care provider. Make sure you discuss any questions you have with your health care provider. Document Revised: 02/27/2018 Document Reviewed: 02/03/2018 Elsevier Patient Education  2021 [place]. Prediabetes Eating Plan Prediabetes is a condition that causes blood sugar (glucose) levels to be higher than normal. This increases the risk for developing type 2 diabetes (type 2 diabetes mellitus). Working with a health care provider or nutrition specialist (dietitian) to make diet and lifestyle changes can help prevent the onset of diabetes. These changes may help you:  Control your blood glucose levels.  Improve your cholesterol levels.  Manage your blood pressure. What are tips for following this plan? Reading food labels  Read food labels to check the amount of fat, salt (sodium), and sugar in prepackaged foods. Avoid foods that have: ? Saturated fats. ? Trans  fats. ? Added sugars.  Avoid foods that have more than 300 milligrams (mg) of sodium per serving. Limit your sodium intake to less than 2,300 mg each day. Shopping  Avoid buying pre-made and processed foods.  Avoid buying drinks with added sugar. Cooking  Cook with olive oil. Do not use butter, lard, or ghee.  Bake, broil, grill, steam, or boil foods. Avoid frying. Meal planning  Work with your dietitian to create an eating plan that is right for you. This may include  tracking how many calories you take in each day. Use a food diary, notebook, or mobile application to track what you eat at each meal.  Consider following a Mediterranean diet. This includes: ? Eating several servings of fresh fruits and vegetables each day. ? Eating fish at least twice a week. ? Eating one serving each day of whole grains, beans, nuts, and seeds. ? Using olive oil instead of other fats. ? Limiting alcohol. ? Limiting red meat. ? Using nonfat or low-fat dairy products.  Consider following a plant-based diet. This includes dietary choices that focus on eating mostly vegetables and fruit, grains, beans, nuts, and seeds.  If you have high blood pressure, you may need to limit your sodium intake or follow a diet such as the DASH (Dietary Approaches to Stop Hypertension) eating plan. The DASH diet aims to lower high blood pressure.   Lifestyle  Set weight loss goals with help from your health care team. It is recommended that most people with prediabetes lose 7% of their body weight.  Exercise for at least 30 minutes 5 or more days a week.  Attend a support group or seek support from a mental health counselor.  Take over-the-counter and prescription medicines only as told by your health care provider. What foods are recommended? Fruits Berries. Bananas. Apples. Oranges. Grapes. Papaya. Mango. Pomegranate. Kiwi. Grapefruit. Cherries. Vegetables Lettuce. Spinach. Peas. Beets. Cauliflower. Cabbage. Broccoli. Carrots. Tomatoes. Squash. Eggplant. Herbs. Peppers. Onions. Cucumbers. Brussels sprouts. Grains Whole grains, such as whole-wheat or whole-grain breads, crackers, cereals, and pasta. Unsweetened oatmeal. Bulgur. Barley. Quinoa. Brown rice. Corn or whole-wheat flour tortillas or taco shells. Meats and other proteins Seafood. Poultry without skin. Lean cuts of pork and beef. Tofu. Eggs. Nuts. Beans. Dairy Low-fat or fat-free dairy products, such as yogurt, cottage cheese,  and cheese. Beverages Water. Tea. Coffee. Sugar-free or diet soda. Seltzer water. Low-fat or nonfat milk. Milk alternatives, such as soy or almond milk. Fats and oils Olive oil. Canola oil. Sunflower oil. Grapeseed oil. Avocado. Walnuts. Sweets and desserts Sugar-free or low-fat pudding. Sugar-free or low-fat ice cream and other frozen treats. Seasonings and condiments Herbs. Sodium-free spices. Mustard. Relish. Low-salt, low-sugar ketchup. Low-salt, low-sugar barbecue sauce. Low-fat or fat-free mayonnaise. The items listed above may not be a complete list of recommended foods and beverages. Contact a dietitian for more information. What foods are not recommended? Fruits Fruits canned with syrup. Vegetables Canned vegetables. Frozen vegetables with butter or cream sauce. Grains Refined white flour and flour products, such as bread, pasta, snack foods, and cereals. Meats and other proteins Fatty cuts of meat. Poultry with skin. Breaded or fried meat. Processed meats. Dairy Full-fat yogurt, cheese, or milk. Beverages Sweetened drinks, such as iced tea and soda. Fats and oils Butter. Lard. Ghee. Sweets and desserts Baked goods, such as cake, cupcakes, pastries, cookies, and cheesecake. Seasonings and condiments Spice mixes with added salt. Ketchup. Barbecue sauce. Mayonnaise. The items listed above may not be a complete list of  foods and beverages that are not recommended. Contact a dietitian for more information. Where to find more information  American Diabetes Association: www.diabetes.org Summary  You may need to make diet and lifestyle changes to help prevent the onset of diabetes. These changes can help you control blood sugar, improve cholesterol levels, and manage blood pressure.  Set weight loss goals with help from your health care team. It is recommended that most people with prediabetes lose 7% of their body weight.  Consider following a Mediterranean diet. This  includes eating plenty of fresh fruits and vegetables, whole grains, beans, nuts, seeds, fish, and low-fat dairy, and using olive oil instead of other fats. This information is not intended to replace advice given to you by your health care provider. Make sure you discuss any questions you have with your health care provider. Document Revised: 10/04/2019 Document Reviewed: 10/04/2019 Elsevier Patient Education  2021 [place]. Losartan Hypertension, Adult High blood pressure (hypertension) is when the force of blood pumping through the arteries is too strong. The arteries are the blood vessels that carry blood from the heart throughout the body. Hypertension forces the heart to work harder to pump blood and may cause arteries to become narrow or stiff. Untreated or uncontrolled hypertension can cause a heart attack, heart failure, a stroke, kidney disease, and other problems. A blood pressure reading consists of a higher number over a lower number. Ideally, your blood pressure should be below 120/80. The first ("top") number is called the systolic pressure. It is a measure of the pressure in your arteries as your heart beats. The second ("bottom") number is called the diastolic pressure. It is a measure of the pressure in your arteries as the heart relaxes. What are the causes? The exact cause of this condition is not known. There are some conditions that result in or are related to high blood pressure. What increases the risk? Some risk factors for high blood pressure are under your control. The following factors may make you more likely to develop this condition:  Smoking.  Having type 2 diabetes mellitus, high cholesterol, or both.  Not getting enough exercise or physical activity.  Being overweight.  Having too much fat, sugar, calories, or salt (sodium) in your diet.  Drinking too much alcohol. Some risk factors for high blood pressure may be difficult or impossible to change. Some  of these factors include:  Having chronic kidney disease.  Having a family history of high blood pressure.  Age. Risk increases with age.  Race. You may be at higher risk if you are African American.  Gender. Men are at higher risk than women before age 57. After age 93, women are at higher risk than men.  Having obstructive sleep apnea.  Stress. What are the signs or symptoms? High blood pressure may not cause symptoms. Very high blood pressure (hypertensive crisis) may cause:  Headache.  Anxiety.  Shortness of breath.  Nosebleed.  Nausea and vomiting.  Vision changes.  Severe chest pain.  Seizures. How is this diagnosed? This condition is diagnosed by measuring your blood pressure while you are seated, with your arm resting on a flat surface, your legs uncrossed, and your feet flat on the floor. The cuff of the blood pressure monitor will be placed directly against the skin of your upper arm at the level of your heart. It should be measured at least twice using the same arm. Certain conditions can cause a difference in blood pressure between your right and  left arms. Certain factors can cause blood pressure readings to be lower or higher than normal for a short period of time:  When your blood pressure is higher when you are in a health care provider's office than when you are at home, this is called white coat hypertension. Most people with this condition do not need medicines.  When your blood pressure is higher at home than when you are in a health care provider's office, this is called masked hypertension. Most people with this condition may need medicines to control blood pressure. If you have a high blood pressure reading during one visit or you have normal blood pressure with other risk factors, you may be asked to:  Return on a different day to have your blood pressure checked again.  Monitor your blood pressure at home for 1 week or longer. If you are diagnosed  with hypertension, you may have other blood or imaging tests to help your health care provider understand your overall risk for other conditions. How is this treated? This condition is treated by making healthy lifestyle changes, such as eating healthy foods, exercising more, and reducing your alcohol intake. Your health care provider may prescribe medicine if lifestyle changes are not enough to get your blood pressure under control, and if:  Your systolic blood pressure is above 130.  Your diastolic blood pressure is above 80. Your personal target blood pressure may vary depending on your medical conditions, your age, and other factors. Follow these instructions at home: Eating and drinking  Eat a diet that is high in fiber and potassium, and low in sodium, added sugar, and fat. An example eating plan is called the DASH (Dietary Approaches to Stop Hypertension) diet. To eat this way: ? Eat plenty of fresh fruits and vegetables. Try to fill one half of your plate at each meal with fruits and vegetables. ? Eat whole grains, such as whole-wheat pasta, brown rice, or whole-grain bread. Fill about one fourth of your plate with whole grains. ? Eat or drink low-fat dairy products, such as skim milk or low-fat yogurt. ? Avoid fatty cuts of meat, processed or cured meats, and poultry with skin. Fill about one fourth of your plate with lean proteins, such as fish, chicken without skin, beans, eggs, or tofu. ? Avoid pre-made and processed foods. These tend to be higher in sodium, added sugar, and fat.  Reduce your daily sodium intake. Most people with hypertension should eat less than 1,500 mg of sodium a day.  Do not drink alcohol if: ? Your health care provider tells you not to drink. ? You are pregnant, may be pregnant, or are planning to become pregnant.  If you drink alcohol: ? Limit how much you use to:  0-1 drink a day for women.  0-2 drinks a day for men. ? Be aware of how much alcohol is  in your drink. In the [place] one drink equals one 12 oz bottle of beer (355 mL), one 5 oz glass of wine (148 mL), or one 1 oz glass of hard liquor (44 mL).   Lifestyle  Work with your health care provider to maintain a healthy body weight or to lose weight. Ask what an ideal weight is for you.  Get at least 30 minutes of exercise most days of the week. Activities may include walking, swimming, or biking.  Include exercise to strengthen your muscles (resistance exercise), such as Pilates or lifting weights, as part of your weekly exercise routine. Try  to do these types of exercises for 30 minutes at least 3 days a week.  Do not use any products that contain nicotine or tobacco, such as cigarettes, e-cigarettes, and chewing tobacco. If you need help quitting, ask your health care provider.  Monitor your blood pressure at home as told by your health care provider.  Keep all follow-up visits as told by your health care provider. This is important.   Medicines  Take over-the-counter and prescription medicines only as told by your health care provider. Follow directions carefully. Blood pressure medicines must be taken as prescribed.  Do not skip doses of blood pressure medicine. Doing this puts you at risk for problems and can make the medicine less effective.  Ask your health care provider about side effects or reactions to medicines that you should watch for. Contact a health care provider if you:  Think you are having a reaction to a medicine you are taking.  Have headaches that keep coming back (recurring).  Feel dizzy.  Have swelling in your ankles.  Have trouble with your vision. Get help right away if you:  Develop a severe headache or confusion.  Have unusual weakness or numbness.  Feel faint.  Have severe pain in your chest or abdomen.  Vomit repeatedly.  Have trouble breathing. Summary  Hypertension is when the force of blood pumping through your arteries is too  strong. If this condition is not controlled, it may put you at risk for serious complications.  Your personal target blood pressure may vary depending on your medical conditions, your age, and other factors. For most people, a normal blood pressure is less than 120/80.  Hypertension is treated with lifestyle changes, medicines, or a combination of both. Lifestyle changes include losing weight, eating a healthy, low-sodium diet, exercising more, and limiting alcohol. This information is not intended to replace advice given to you by your health care provider. Make sure you discuss any questions you have with your health care provider. Document Revised: 03/15/2018 Document Reviewed: 03/15/2018 Elsevier Patient Education  2021 [employer].

## 2020-12-10 NOTE — Assessment & Plan Note (Signed)
Diet and nutrition discussed.  Patient prefers nutritional approach before starting cholesterol medication.  Advised to increase amount of regular physical exercise.

## 2020-12-10 NOTE — Progress Notes (Signed)
Renee Swanson 60 y.o.   Chief Complaint  Patient presents with  . New Patient (Initial Visit)    Pt would like to get cholesterol and BP management.    HISTORY OF PRESENT ILLNESS: This is a 60 y.o. female first visit to this office here to establish care with me. Recent labs done on [facility] office showed prediabetes with hemoglobin A1c at 6.1 and abnormal lipid profile with total cholesterol of 262, triglycerides 154 and LDL cholesterol 174. Blood pressure has also been a concern.  No history of hypertension.  She was once started on a diuretic due to leg edema post DVT right leg. No other complaints or medical concerns today.   HPI   Prior to Admission medications   Medication Sig Start Date End Date Taking? Authorizing Provider  aspirin 81 MG tablet Take 81 mg by mouth daily.   Yes [provider]  Cholecalciferol (VITAMIN D PO) Take 1 capsule by mouth daily. 1000 units   Yes [provider]  Cyanocobalamin (VITAMIN B12 PO) Take 1,500 mcg by mouth.   Yes [provider]  hydrochlorothiazide (HYDRODIURIL) 25 MG tablet Take 25 mg by mouth daily.   Yes [provider]  meloxicam (MOBIC) 7.5 MG tablet Take 1 tablet (7.5 mg total) by mouth daily. 01/02/19  Yes Volanda Napoleon, PA-C  Multiple Vitamins-Minerals (CENTRUM PO) Take 1 tablet by mouth daily.   Yes [provider]    Allergies  Allergen Reactions  . Sulfa Antibiotics Rash    There are no problems to display for this patient.   Past Medical History:  Diagnosis Date  . Clotting disorder ([place])    changed BCP and it caused blood clots  . Colon polyps    history of colon polyps  . H/O blood clots 2012   leg  . Heart murmur     Past Surgical History:  Procedure Laterality Date  . COLONOSCOPY W/ POLYPECTOMY    . NO PAST SURGERIES      Social History   Socioeconomic History  . Marital status: Married    Spouse name: Not on file  . Number of children: Not on file   . Years of education: Not on file  . Highest education level: Not on file  Occupational History  . Not on file  Tobacco Use  . Smoking status: Never Smoker  . Smokeless tobacco: Never Used  Substance and Sexual Activity  . Alcohol use: No  . Drug use: No  . Sexual activity: Not on file  Other Topics Concern  . Not on file  Social History Narrative  . Not on file   Social Determinants of Health   Financial Resource Strain: Not on file  Food Insecurity: Not on file  Transportation Needs: Not on file  Physical Activity: Not on file  Stress: Not on file  Social Connections: Not on file  Intimate Partner Violence: Not on file    Family History  Problem Relation Age of Onset  . Colon cancer Father 56     Review of Systems  Constitutional: Negative.  Negative for chills and fever.  HENT: Negative.  Negative for congestion and sore throat.   Respiratory: Negative.  Negative for cough and shortness of breath.   Cardiovascular: Negative.  Negative for chest pain and palpitations.  Gastrointestinal: Negative for abdominal pain, diarrhea, nausea and vomiting.  Genitourinary: Negative.  Negative for dysuria and hematuria.  Musculoskeletal: Negative.  Negative for myalgias and neck pain.  Skin:  Negative.  Negative for rash.  Neurological: Negative.  Negative for dizziness and headaches.  All other systems reviewed and are negative.  Today's Vitals   12/10/20 1502  BP: (!) 149/88  Pulse: 77  Temp: 98.2 F (36.8 C)  TempSrc: Oral  SpO2: 99%  Weight: 226 lb (102.5 kg)  Height: 5\' 5"  (1.651 m)   Body mass index is 37.61 kg/m. Wt Readings from Last 3 Encounters:  12/10/20 226 lb (102.5 kg)  01/02/19 225 lb (102.1 kg)  04/28/17 238 lb (108 kg)     Physical Exam Vitals reviewed.  Constitutional:      Appearance: Normal appearance. She is obese.  HENT:     Head: Normocephalic.  Eyes:     Extraocular Movements: Extraocular movements intact.     Conjunctiva/sclera:  Conjunctivae normal.     Pupils: Pupils are equal, round, and reactive to light.  Cardiovascular:     Rate and Rhythm: Normal rate and regular rhythm.     Pulses: Normal pulses.     Heart sounds: Normal heart sounds.  Pulmonary:     Effort: Pulmonary effort is normal.     Breath sounds: Normal breath sounds.  Musculoskeletal:     Cervical back: Normal range of motion and neck supple.  Neurological:     Mental Status: She is alert.  Psychiatric:        Mood and Affect: Mood normal.        Behavior: Behavior normal.      ASSESSMENT & PLAN: A total of 45 minutes was spent with the patient and counseling/coordination of care regarding establishing care with me, hypertension, dyslipidemia, obesity, prediabetes and cardiovascular risks associated with these conditions, education on nutrition, review of most recent labs, medication options for prediabetes and dyslipidemia, need for hypertension treatment and need for blood pressure readings monitoring at home, start of losartan-HCTZ 50-12.5 mg daily, prognosis, documentation and need for follow-up.  Essential hypertension Elevated blood pressure in the office.  Repeat blood pressure 160/100. Will start losartan/HCTZ 50-12.5 mg daily.  Advised to monitor blood pressure readings at home and keep a log. Follow-up in 3 months.  Dyslipidemia Diet and nutrition discussed.  Patient prefers nutritional approach before starting cholesterol medication.  Advised to increase amount of regular physical exercise.  Prediabetes Diet and nutrition discussed.  Medication options like metformin and or Ozempic discussed.  Patient prefers nutritional approach with increased exercise first.  Renee Swanson was seen today for new patient (initial visit).  Diagnoses and all orders for this visit:  Essential hypertension -     losartan-hydrochlorothiazide (HYZAAR) 50-12.5 MG tablet; Take 1 tablet by mouth daily.  Encounter to establish  care  Dyslipidemia  Prediabetes    Patient Instructions   Dyslipidemia Dyslipidemia is an imbalance of waxy, fat-like substances (lipids) in the blood. The body needs lipids in small amounts. Dyslipidemia often involves a high level of cholesterol or triglycerides, which are types of lipids. Common forms of dyslipidemia include:  High levels of LDL cholesterol. LDL is the type of cholesterol that causes fatty deposits (plaques) to build up in the blood vessels that carry blood away from your heart (arteries).  Low levels of HDL cholesterol. HDL cholesterol is the type of cholesterol that protects against heart disease. High levels of HDL remove the LDL buildup from arteries.  High levels of triglycerides. Triglycerides are a fatty substance in the blood that is linked to a buildup of plaques in the arteries. What are the causes? Primary dyslipidemia is  caused by changes (mutations) in genes that are passed down through families (inherited). These mutations cause several types of dyslipidemia. Secondary dyslipidemia is caused by lifestyle choices and diseases that lead to dyslipidemia, such as:  Eating a diet that is high in animal fat.  Not getting enough exercise.  Having diabetes, kidney disease, liver disease, or thyroid disease.  Drinking large amounts of alcohol.  Using certain medicines. What increases the risk? You are more likely to develop this condition if you are an older man or if you are a woman who has gone through menopause. Other risk factors include:  Having a family history of dyslipidemia.  Taking certain medicines, including birth control pills, steroids, some diuretics, and beta-blockers.  Smoking cigarettes.  Eating a high-fat diet.  Having certain medical conditions such as diabetes, polycystic ovary syndrome (PCOS), kidney disease, liver disease, or hypothyroidism.  Not exercising regularly.  Being overweight or obese with too much belly  fat. What are the signs or symptoms? In most cases, dyslipidemia does not usually cause any symptoms. In severe cases, very high lipid levels can cause:  Fatty bumps under the skin (xanthomas).  White or gray ring around the black center (pupil) of the eye. Very high triglyceride levels can cause inflammation of the pancreas (pancreatitis). How is this diagnosed? Your health care provider may diagnose dyslipidemia based on a routine blood test (fasting blood test). Because most people do not have symptoms of the condition, this blood testing (lipid profile) is done on adults age 70 and older and is repeated every 5 years. This test checks:  Total cholesterol. This measures the total amount of cholesterol in your blood, including LDL cholesterol, HDL cholesterol, and triglycerides. A healthy number is below 200.  LDL cholesterol. The target number for LDL cholesterol is different for each person, depending on individual risk factors. Ask your health care provider what your LDL cholesterol should be.  HDL cholesterol. An HDL level of 60 or higher is best because it helps to protect against heart disease. A number below 28 for men or below 54 for women increases the risk for heart disease.  Triglycerides. A healthy triglyceride number is below 150. If your lipid profile is abnormal, your health care provider may do other blood tests.   How is this treated? Treatment depends on the type of dyslipidemia that you have and your other risk factors for heart disease and stroke. Your health care provider will have a target range for your lipid levels based on this information. For many people, this condition may be treated by lifestyle changes, such as diet and exercise. Your health care provider may recommend that you:  Get regular exercise.  Make changes to your diet.  Quit smoking if you smoke. If diet changes and exercise do not help you reach your goals, your health care provider may also  prescribe medicine to lower lipids. The most commonly prescribed type of medicine lowers your LDL cholesterol (statin drug). If you have a high triglyceride level, your provider may prescribe another type of drug (fibrate) or an omega-3 fish oil supplement, or both. Follow these instructions at home: Eating and drinking  Follow instructions from your health care provider or dietitian about eating or drinking restrictions.  Eat a healthy diet as told by your health care provider. This can help you reach and maintain a healthy weight, lower your LDL cholesterol, and raise your HDL cholesterol. This may include: ? Limiting your calories, if you are overweight. ? Eating  more fruits, vegetables, whole grains, fish, and lean meats. ? Limiting saturated fat, trans fat, and cholesterol.  If you drink alcohol: ? Limit how much you use. ? Be aware of how much alcohol is in your drink. In the [place] one drink equals one 12 oz bottle of beer (355 mL), one 5 oz glass of wine (148 mL), or one 1 oz glass of hard liquor (44 mL).  Do not drink alcohol if: ? Your health care provider tells you not to drink. ? You are pregnant, may be pregnant, or are planning to become pregnant. Activity  Get regular exercise. Start an exercise and strength training program as told by your health care provider. Ask your health care provider what activities are safe for you. Your health care provider may recommend: ? 30 minutes of aerobic activity 4-6 days a week. Brisk walking is an example of aerobic activity. ? Strength training 2 days a week. General instructions  Do not use any products that contain nicotine or tobacco, such as cigarettes, e-cigarettes, and chewing tobacco. If you need help quitting, ask your health care provider.  Take over-the-counter and prescription medicines only as told by your health care provider. This includes supplements.  Keep all follow-up visits as told by your health care provider.    Contact a health care provider if:  You are: ? Having trouble sticking to your exercise or diet plan. ? Struggling to quit smoking or control your use of alcohol. Summary  Dyslipidemia often involves a high level of cholesterol or triglycerides, which are types of lipids.  Treatment depends on the type of dyslipidemia that you have and your other risk factors for heart disease and stroke.  For many people, treatment starts with lifestyle changes, such as diet and exercise.  Your health care provider may prescribe medicine to lower lipids. This information is not intended to replace advice given to you by your health care provider. Make sure you discuss any questions you have with your health care provider. Document Revised: 02/27/2018 Document Reviewed: 02/03/2018 Elsevier Patient Education  2021 [place]. Prediabetes Eating Plan Prediabetes is a condition that causes blood sugar (glucose) levels to be higher than normal. This increases the risk for developing type 2 diabetes (type 2 diabetes mellitus). Working with a health care provider or nutrition specialist (dietitian) to make diet and lifestyle changes can help prevent the onset of diabetes. These changes may help you:  Control your blood glucose levels.  Improve your cholesterol levels.  Manage your blood pressure. What are tips for following this plan? Reading food labels  Read food labels to check the amount of fat, salt (sodium), and sugar in prepackaged foods. Avoid foods that have: ? Saturated fats. ? Trans fats. ? Added sugars.  Avoid foods that have more than 300 milligrams (mg) of sodium per serving. Limit your sodium intake to less than 2,300 mg each day. Shopping  Avoid buying pre-made and processed foods.  Avoid buying drinks with added sugar. Cooking  Cook with olive oil. Do not use butter, lard, or ghee.  Bake, broil, grill, steam, or boil foods. Avoid frying. Meal planning  Work with your  dietitian to create an eating plan that is right for you. This may include tracking how many calories you take in each day. Use a food diary, notebook, or mobile application to track what you eat at each meal.  Consider following a Mediterranean diet. This includes: ? Eating several servings of fresh fruits and vegetables each  day. ? Eating fish at least twice a week. ? Eating one serving each day of whole grains, beans, nuts, and seeds. ? Using olive oil instead of other fats. ? Limiting alcohol. ? Limiting red meat. ? Using nonfat or low-fat dairy products.  Consider following a plant-based diet. This includes dietary choices that focus on eating mostly vegetables and fruit, grains, beans, nuts, and seeds.  If you have high blood pressure, you may need to limit your sodium intake or follow a diet such as the DASH (Dietary Approaches to Stop Hypertension) eating plan. The DASH diet aims to lower high blood pressure.   Lifestyle  Set weight loss goals with help from your health care team. It is recommended that most people with prediabetes lose 7% of their body weight.  Exercise for at least 30 minutes 5 or more days a week.  Attend a support group or seek support from a mental health counselor.  Take over-the-counter and prescription medicines only as told by your health care provider. What foods are recommended? Fruits Berries. Bananas. Apples. Oranges. Grapes. Papaya. Mango. Pomegranate. Kiwi. Grapefruit. Cherries. Vegetables Lettuce. Spinach. Peas. Beets. Cauliflower. Cabbage. Broccoli. Carrots. Tomatoes. Squash. Eggplant. Herbs. Peppers. Onions. Cucumbers. Brussels sprouts. Grains Whole grains, such as whole-wheat or whole-grain breads, crackers, cereals, and pasta. Unsweetened oatmeal. Bulgur. Barley. Quinoa. Brown rice. Corn or whole-wheat flour tortillas or taco shells. Meats and other proteins Seafood. Poultry without skin. Lean cuts of pork and beef. Tofu. Eggs. Nuts.  Beans. Dairy Low-fat or fat-free dairy products, such as yogurt, cottage cheese, and cheese. Beverages Water. Tea. Coffee. Sugar-free or diet soda. Seltzer water. Low-fat or nonfat milk. Milk alternatives, such as soy or almond milk. Fats and oils Olive oil. Canola oil. Sunflower oil. Grapeseed oil. Avocado. Walnuts. Sweets and desserts Sugar-free or low-fat pudding. Sugar-free or low-fat ice cream and other frozen treats. Seasonings and condiments Herbs. Sodium-free spices. Mustard. Relish. Low-salt, low-sugar ketchup. Low-salt, low-sugar barbecue sauce. Low-fat or fat-free mayonnaise. The items listed above may not be a complete list of recommended foods and beverages. Contact a dietitian for more information. What foods are not recommended? Fruits Fruits canned with syrup. Vegetables Canned vegetables. Frozen vegetables with butter or cream sauce. Grains Refined white flour and flour products, such as bread, pasta, snack foods, and cereals. Meats and other proteins Fatty cuts of meat. Poultry with skin. Breaded or fried meat. Processed meats. Dairy Full-fat yogurt, cheese, or milk. Beverages Sweetened drinks, such as iced tea and soda. Fats and oils Butter. Lard. Ghee. Sweets and desserts Baked goods, such as cake, cupcakes, pastries, cookies, and cheesecake. Seasonings and condiments Spice mixes with added salt. Ketchup. Barbecue sauce. Mayonnaise. The items listed above may not be a complete list of foods and beverages that are not recommended. Contact a dietitian for more information. Where to find more information  American Diabetes Association: www.diabetes.org Summary  You may need to make diet and lifestyle changes to help prevent the onset of diabetes. These changes can help you control blood sugar, improve cholesterol levels, and manage blood pressure.  Set weight loss goals with help from your health care team. It is recommended that most people with prediabetes  lose 7% of their body weight.  Consider following a Mediterranean diet. This includes eating plenty of fresh fruits and vegetables, whole grains, beans, nuts, seeds, fish, and low-fat dairy, and using olive oil instead of other fats. This information is not intended to replace advice given to you by your health care provider.  Make sure you discuss any questions you have with your health care provider. Document Revised: 10/04/2019 Document Reviewed: 10/04/2019 Elsevier Patient Education  2021 [place]. Losartan Hypertension, Adult High blood pressure (hypertension) is when the force of blood pumping through the arteries is too strong. The arteries are the blood vessels that carry blood from the heart throughout the body. Hypertension forces the heart to work harder to pump blood and may cause arteries to become narrow or stiff. Untreated or uncontrolled hypertension can cause a heart attack, heart failure, a stroke, kidney disease, and other problems. A blood pressure reading consists of a higher number over a lower number. Ideally, your blood pressure should be below 120/80. The first ("top") number is called the systolic pressure. It is a measure of the pressure in your arteries as your heart beats. The second ("bottom") number is called the diastolic pressure. It is a measure of the pressure in your arteries as the heart relaxes. What are the causes? The exact cause of this condition is not known. There are some conditions that result in or are related to high blood pressure. What increases the risk? Some risk factors for high blood pressure are under your control. The following factors may make you more likely to develop this condition:  Smoking.  Having type 2 diabetes mellitus, high cholesterol, or both.  Not getting enough exercise or physical activity.  Being overweight.  Having too much fat, sugar, calories, or salt (sodium) in your diet.  Drinking too much alcohol. Some risk  factors for high blood pressure may be difficult or impossible to change. Some of these factors include:  Having chronic kidney disease.  Having a family history of high blood pressure.  Age. Risk increases with age.  Race. You may be at higher risk if you are African American.  Gender. Men are at higher risk than women before age 82. After age 105, women are at higher risk than men.  Having obstructive sleep apnea.  Stress. What are the signs or symptoms? High blood pressure may not cause symptoms. Very high blood pressure (hypertensive crisis) may cause:  Headache.  Anxiety.  Shortness of breath.  Nosebleed.  Nausea and vomiting.  Vision changes.  Severe chest pain.  Seizures. How is this diagnosed? This condition is diagnosed by measuring your blood pressure while you are seated, with your arm resting on a flat surface, your legs uncrossed, and your feet flat on the floor. The cuff of the blood pressure monitor will be placed directly against the skin of your upper arm at the level of your heart. It should be measured at least twice using the same arm. Certain conditions can cause a difference in blood pressure between your right and left arms. Certain factors can cause blood pressure readings to be lower or higher than normal for a short period of time:  When your blood pressure is higher when you are in a health care provider's office than when you are at home, this is called white coat hypertension. Most people with this condition do not need medicines.  When your blood pressure is higher at home than when you are in a health care provider's office, this is called masked hypertension. Most people with this condition may need medicines to control blood pressure. If you have a high blood pressure reading during one visit or you have normal blood pressure with other risk factors, you may be asked to:  Return on a different day to have your blood  pressure checked  again.  Monitor your blood pressure at home for 1 week or longer. If you are diagnosed with hypertension, you may have other blood or imaging tests to help your health care provider understand your overall risk for other conditions. How is this treated? This condition is treated by making healthy lifestyle changes, such as eating healthy foods, exercising more, and reducing your alcohol intake. Your health care provider may prescribe medicine if lifestyle changes are not enough to get your blood pressure under control, and if:  Your systolic blood pressure is above 130.  Your diastolic blood pressure is above 80. Your personal target blood pressure may vary depending on your medical conditions, your age, and other factors. Follow these instructions at home: Eating and drinking  Eat a diet that is high in fiber and potassium, and low in sodium, added sugar, and fat. An example eating plan is called the DASH (Dietary Approaches to Stop Hypertension) diet. To eat this way: ? Eat plenty of fresh fruits and vegetables. Try to fill one half of your plate at each meal with fruits and vegetables. ? Eat whole grains, such as whole-wheat pasta, brown rice, or whole-grain bread. Fill about one fourth of your plate with whole grains. ? Eat or drink low-fat dairy products, such as skim milk or low-fat yogurt. ? Avoid fatty cuts of meat, processed or cured meats, and poultry with skin. Fill about one fourth of your plate with lean proteins, such as fish, chicken without skin, beans, eggs, or tofu. ? Avoid pre-made and processed foods. These tend to be higher in sodium, added sugar, and fat.  Reduce your daily sodium intake. Most people with hypertension should eat less than 1,500 mg of sodium a day.  Do not drink alcohol if: ? Your health care provider tells you not to drink. ? You are pregnant, may be pregnant, or are planning to become pregnant.  If you drink alcohol: ? Limit how much you use  to:  0-1 drink a day for women.  0-2 drinks a day for men. ? Be aware of how much alcohol is in your drink. In the [place] one drink equals one 12 oz bottle of beer (355 mL), one 5 oz glass of wine (148 mL), or one 1 oz glass of hard liquor (44 mL).   Lifestyle  Work with your health care provider to maintain a healthy body weight or to lose weight. Ask what an ideal weight is for you.  Get at least 30 minutes of exercise most days of the week. Activities may include walking, swimming, or biking.  Include exercise to strengthen your muscles (resistance exercise), such as Pilates or lifting weights, as part of your weekly exercise routine. Try to do these types of exercises for 30 minutes at least 3 days a week.  Do not use any products that contain nicotine or tobacco, such as cigarettes, e-cigarettes, and chewing tobacco. If you need help quitting, ask your health care provider.  Monitor your blood pressure at home as told by your health care provider.  Keep all follow-up visits as told by your health care provider. This is important.   Medicines  Take over-the-counter and prescription medicines only as told by your health care provider. Follow directions carefully. Blood pressure medicines must be taken as prescribed.  Do not skip doses of blood pressure medicine. Doing this puts you at risk for problems and can make the medicine less effective.  Ask your health care provider about  side effects or reactions to medicines that you should watch for. Contact a health care provider if you:  Think you are having a reaction to a medicine you are taking.  Have headaches that keep coming back (recurring).  Feel dizzy.  Have swelling in your ankles.  Have trouble with your vision. Get help right away if you:  Develop a severe headache or confusion.  Have unusual weakness or numbness.  Feel faint.  Have severe pain in your chest or abdomen.  Vomit repeatedly.  Have trouble  breathing. Summary  Hypertension is when the force of blood pumping through your arteries is too strong. If this condition is not controlled, it may put you at risk for serious complications.  Your personal target blood pressure may vary depending on your medical conditions, your age, and other factors. For most people, a normal blood pressure is less than 120/80.  Hypertension is treated with lifestyle changes, medicines, or a combination of both. Lifestyle changes include losing weight, eating a healthy, low-sodium diet, exercising more, and limiting alcohol. This information is not intended to replace advice given to you by your health care provider. Make sure you discuss any questions you have with your health care provider. Document Revised: 03/15/2018 Document Reviewed: 03/15/2018 Elsevier Patient Education  2021 [place], [place] [facility]

## 2020-12-10 NOTE — Assessment & Plan Note (Signed)
Elevated blood pressure in the office.  Repeat blood pressure 160/100. Will start losartan/HCTZ 50-12.5 mg daily.  Advised to monitor blood pressure readings at home and keep a log. Follow-up in 3 months.

## 2020-12-10 NOTE — Assessment & Plan Note (Signed)
Diet and nutrition discussed.  Medication options like metformin and or Ozempic discussed.  Patient prefers nutritional approach with increased exercise first.

## 2021-03-12 ENCOUNTER — Ambulatory Visit: Payer: 59 | Admitting: Emergency Medicine

## 2021-03-12 ENCOUNTER — Other Ambulatory Visit: Payer: Self-pay

## 2021-03-12 ENCOUNTER — Encounter: Payer: Self-pay | Admitting: Emergency Medicine

## 2021-03-12 VITALS — BP 138/86 | HR 94 | Temp 98.3°F | Ht 65.0 in | Wt 219.0 lb

## 2021-03-12 DIAGNOSIS — R7303 Prediabetes: Secondary | ICD-10-CM

## 2021-03-12 DIAGNOSIS — Z23 Encounter for immunization: Secondary | ICD-10-CM

## 2021-03-12 DIAGNOSIS — I10 Essential (primary) hypertension: Secondary | ICD-10-CM | POA: Diagnosis not present

## 2021-03-12 NOTE — Patient Instructions (Signed)
Health Maintenance, Female Adopting a healthy lifestyle and getting preventive care are important in promoting health and wellness. Ask your health care provider about: The right schedule for you to have regular tests and exams. Things you can do on your own to prevent diseases and keep yourself healthy. What should I know about diet, weight, and exercise? Eat a healthy diet  Eat a diet that includes plenty of vegetables, fruits, low-fat dairy products, and lean protein. Do not eat a lot of foods that are high in solid fats, added sugars, or sodium.  Maintain a healthy weight Body mass index (BMI) is used to identify weight problems. It estimates body fat based on height and weight. Your health care provider can help determineyour BMI and help you achieve or maintain a healthy weight. Get regular exercise Get regular exercise. This is one of the most important things you can do for your health. Most adults should: Exercise for at least 150 minutes each week. The exercise should increase your heart rate and make you sweat (moderate-intensity exercise). Do strengthening exercises at least twice a week. This is in addition to the moderate-intensity exercise. Spend less time sitting. Even light physical activity can be beneficial. Watch cholesterol and blood lipids Have your blood tested for lipids and cholesterol at 60 years of age, then havethis test every 5 years. Have your cholesterol levels checked more often if: Your lipid or cholesterol levels are high. You are older than 60 years of age. You are at high risk for heart disease. What should I know about cancer screening? Depending on your health history and family history, you may need to have cancer screening at various ages. This may include screening for: Breast cancer. Cervical cancer. Colorectal cancer. Skin cancer. Lung cancer. What should I know about heart disease, diabetes, and high blood pressure? Blood pressure and heart  disease High blood pressure causes heart disease and increases the risk of stroke. This is more likely to develop in people who have high blood pressure readings, are of African descent, or are overweight. Have your blood pressure checked: Every 3-5 years if you are 18-39 years of age. Every year if you are 40 years old or older. Diabetes Have regular diabetes screenings. This checks your fasting blood sugar level. Have the screening done: Once every three years after age 40 if you are at a normal weight and have a low risk for diabetes. More often and at a younger age if you are overweight or have a high risk for diabetes. What should I know about preventing infection? Hepatitis B If you have a higher risk for hepatitis B, you should be screened for this virus. Talk with your health care provider to find out if you are at risk forhepatitis B infection. Hepatitis C Testing is recommended for: Everyone born from 1945 through 1965. Anyone with known risk factors for hepatitis C. Sexually transmitted infections (STIs) Get screened for STIs, including gonorrhea and chlamydia, if: You are sexually active and are younger than 60 years of age. You are older than 60 years of age and your health care provider tells you that you are at risk for this type of infection. Your sexual activity has changed since you were last screened, and you are at increased risk for chlamydia or gonorrhea. Ask your health care provider if you are at risk. Ask your health care provider about whether you are at high risk for HIV. Your health care provider may recommend a prescription medicine to help   prevent HIV infection. If you choose to take medicine to prevent HIV, you should first get tested for HIV. You should then be tested every 3 months for as long as you are taking the medicine. Pregnancy If you are about to stop having your period (premenopausal) and you may become pregnant, seek counseling before you get  pregnant. Take 400 to 800 micrograms (mcg) of folic acid every day if you become pregnant. Ask for birth control (contraception) if you want to prevent pregnancy. Osteoporosis and menopause Osteoporosis is a disease in which the bones lose minerals and strength with aging. This can result in bone fractures. If you are 65 years old or older, or if you are at risk for osteoporosis and fractures, ask your health care provider if you should: Be screened for bone loss. Take a calcium or vitamin D supplement to lower your risk of fractures. Be given hormone replacement therapy (HRT) to treat symptoms of menopause. Follow these instructions at home: Lifestyle Do not use any products that contain nicotine or tobacco, such as cigarettes, e-cigarettes, and chewing tobacco. If you need help quitting, ask your health care provider. Do not use street drugs. Do not share needles. Ask your health care provider for help if you need support or information about quitting drugs. Alcohol use Do not drink alcohol if: Your health care provider tells you not to drink. You are pregnant, may be pregnant, or are planning to become pregnant. If you drink alcohol: Limit how much you use to 0-1 drink a day. Limit intake if you are breastfeeding. Be aware of how much alcohol is in your drink. In the [place] one drink equals one 12 oz bottle of beer (355 mL), one 5 oz glass of wine (148 mL), or one 1 oz glass of hard liquor (44 mL). General instructions Schedule regular health, dental, and eye exams. Stay current with your vaccines. Tell your health care provider if: You often feel depressed. You have ever been abused or do not feel safe at home. Summary Adopting a healthy lifestyle and getting preventive care are important in promoting health and wellness. Follow your health care provider's instructions about healthy diet, exercising, and getting tested or screened for diseases. Follow your health care provider's  instructions on monitoring your cholesterol and blood pressure. This information is not intended to replace advice given to you by your health care provider. Make sure you discuss any questions you have with your healthcare provider. Document Revised: 06/28/2018 Document Reviewed: 06/28/2018 Elsevier Patient Education  2022 Elsevier Inc[employer]  

## 2021-03-12 NOTE — Assessment & Plan Note (Signed)
Well-controlled hypertension.  Continue Hyzaar 50-12.5 mg daily. Dietary approaches to stop hypertension discussed.

## 2021-03-12 NOTE — Progress Notes (Signed)
Renee Swanson 60 y.o.   Chief Complaint  Patient presents with   Hypertension    HISTORY OF PRESENT ILLNESS: This is a 60 y.o. female with history of hypertension on Hyzaar 50-12.5 mg daily here for follow-up. Doing well.  Has no complaints or medical concerns today.  Hypertension Pertinent negatives include no chest pain, headaches, palpitations or shortness of breath.    Prior to Admission medications   Medication Sig Start Date End Date Taking? Authorizing Provider  aspirin 81 MG tablet Take 81 mg by mouth daily.   Yes [provider]  Cholecalciferol (VITAMIN D PO) Take 1 capsule by mouth daily. 1000 units   Yes [provider]  Cyanocobalamin (VITAMIN B12 PO) Take 1,500 mcg by mouth.   Yes [provider]  losartan-hydrochlorothiazide (HYZAAR) 50-12.5 MG tablet Take 1 tablet by mouth daily. 12/10/20  Yes Spencer Cardinal, Ines Bloomer, MD  meloxicam (MOBIC) 7.5 MG tablet Take 1 tablet (7.5 mg total) by mouth daily. 01/02/19  Yes Volanda Napoleon, PA-C  Multiple Vitamins-Minerals (CENTRUM PO) Take 1 tablet by mouth daily.   Yes [provider]    Allergies  Allergen Reactions   Sulfa Antibiotics Rash    Patient Active Problem List   Diagnosis Date Noted   Essential hypertension 12/10/2020   Dyslipidemia 12/10/2020   Prediabetes 12/10/2020    Past Medical History:  Diagnosis Date   Clotting disorder ([place])    changed BCP and it caused blood clots   Colon polyps    history of colon polyps   H/O blood clots 2012   leg   Heart murmur     Past Surgical History:  Procedure Laterality Date   COLONOSCOPY W/ POLYPECTOMY     NO PAST SURGERIES      Social History   Socioeconomic History   Marital status: Married    Spouse name: Not on file   Number of children: Not on file   Years of education: Not on file   Highest education level: Not on file  Occupational History   Not on file  Tobacco Use   Smoking status: Never   Smokeless  tobacco: Never  Substance and Sexual Activity   Alcohol use: No   Drug use: No   Sexual activity: Not on file  Other Topics Concern   Not on file  Social History Narrative   Not on file   Social Determinants of Health   Financial Resource Strain: Not on file  Food Insecurity: Not on file  Transportation Needs: Not on file  Physical Activity: Not on file  Stress: Not on file  Social Connections: Not on file  Intimate Partner Violence: Not on file    Family History  Problem Relation Age of Onset   Colon cancer Father 66     Review of Systems  Constitutional: Negative.  Negative for chills and fever.  HENT: Negative.  Negative for congestion and sore throat.   Respiratory: Negative.  Negative for cough and shortness of breath.   Cardiovascular: Negative.  Negative for chest pain and palpitations.  Gastrointestinal: Negative.  Negative for abdominal pain, diarrhea, nausea and vomiting.  Genitourinary: Negative.  Negative for dysuria and hematuria.  Skin: Negative.  Negative for rash.  Neurological: Negative.  Negative for dizziness and headaches.  Endo/Heme/Allergies: Negative.   All other systems reviewed and are negative.   Physical Exam Vitals reviewed.  Constitutional:      Appearance: Normal appearance.  HENT:     Head: Normocephalic.  Eyes:     Extraocular Movements: Extraocular movements intact.     Conjunctiva/sclera: Conjunctivae normal.     Pupils: Pupils are equal, round, and reactive to light.  Cardiovascular:     Rate and Rhythm: Normal rate and regular rhythm.     Pulses: Normal pulses.     Heart sounds: Normal heart sounds.  Pulmonary:     Effort: Pulmonary effort is normal.     Breath sounds: Normal breath sounds.  Musculoskeletal:        General: Normal range of motion.     Cervical back: Normal range of motion and neck supple.  Skin:    General: Skin is warm and dry.     Capillary Refill: Capillary refill takes less than 2 seconds.   Neurological:     General: No focal deficit present.     Mental Status: She is alert and oriented to person, place, and time.  Psychiatric:        Mood and Affect: Mood normal.        Behavior: Behavior normal.     ASSESSMENT & PLAN: Jaleea was seen today for hypertension.  Diagnoses and all orders for this visit:  Essential hypertension  Need for shingles vaccine -     Varicella-zoster vaccine IM (Shingrix)  Prediabetes  Essential hypertension Well-controlled hypertension.  Continue Hyzaar 50-12.5 mg daily. Dietary approaches to stop hypertension discussed.  Dyslipidemia    Prediabetes Diet and nutrition discussed.  Advised to decrease amount of daily carbohydrate intake. No history of dyslipidemia.  Patient Instructions  Health Maintenance, Female Adopting a healthy lifestyle and getting preventive care are important in promoting health and wellness. Ask your health care provider about: The right schedule for you to have regular tests and exams. Things you can do on your own to prevent diseases and keep yourself healthy. What should I know about diet, weight, and exercise? Eat a healthy diet  Eat a diet that includes plenty of vegetables, fruits, low-fat dairy products, and lean protein. Do not eat a lot of foods that are high in solid fats, added sugars, or sodium.  Maintain a healthy weight Body mass index (BMI) is used to identify weight problems. It estimates body fat based on height and weight. Your health care provider can help determineyour BMI and help you achieve or maintain a healthy weight. Get regular exercise Get regular exercise. This is one of the most important things you can do for your health. Most adults should: Exercise for at least 150 minutes each week. The exercise should increase your heart rate and make you sweat (moderate-intensity exercise). Do strengthening exercises at least twice a week. This is in addition to the moderate-intensity  exercise. Spend less time sitting. Even light physical activity can be beneficial. Watch cholesterol and blood lipids Have your blood tested for lipids and cholesterol at 60 years of age, then havethis test every 5 years. Have your cholesterol levels checked more often if: Your lipid or cholesterol levels are high. You are older than 60 years of age. You are at high risk for heart disease. What should I know about cancer screening? Depending on your health history and family history, you may need to have cancer screening at various ages. This may include screening for: Breast cancer. Cervical cancer. Colorectal cancer. Skin cancer. Lung cancer. What should I know about heart disease, diabetes, and high blood pressure? Blood pressure and heart disease High blood pressure causes heart disease and increases the risk of stroke. This  is more likely to develop in people who have high blood pressure readings, are of African descent, or are overweight. Have your blood pressure checked: Every 3-5 years if you are 60-47 years of age. Every year if you are 25 years old or older. Diabetes Have regular diabetes screenings. This checks your fasting blood sugar level. Have the screening done: Once every three years after age 31 if you are at a normal weight and have a low risk for diabetes. More often and at a younger age if you are overweight or have a high risk for diabetes. What should I know about preventing infection? Hepatitis B If you have a higher risk for hepatitis B, you should be screened for this virus. Talk with your health care provider to find out if you are at risk forhepatitis B infection. Hepatitis C Testing is recommended for: Everyone born from [birth year] through 1965. Anyone with known risk factors for hepatitis C. Sexually transmitted infections (STIs) Get screened for STIs, including gonorrhea and chlamydia, if: You are sexually active and are younger than 60 years of age. You  are older than 60 years of age and your health care provider tells you that you are at risk for this type of infection. Your sexual activity has changed since you were last screened, and you are at increased risk for chlamydia or gonorrhea. Ask your health care provider if you are at risk. Ask your health care provider about whether you are at high risk for HIV. Your health care provider may recommend a prescription medicine to help prevent HIV infection. If you choose to take medicine to prevent HIV, you should first get tested for HIV. You should then be tested every 3 months for as long as you are taking the medicine. Pregnancy If you are about to stop having your period (premenopausal) and you may become pregnant, seek counseling before you get pregnant. Take 400 to 800 micrograms (mcg) of folic acid every day if you become pregnant. Ask for birth control (contraception) if you want to prevent pregnancy. Osteoporosis and menopause Osteoporosis is a disease in which the bones lose minerals and strength with aging. This can result in bone fractures. If you are 71 years old or older, or if you are at risk for osteoporosis and fractures, ask your health care provider if you should: Be screened for bone loss. Take a calcium or vitamin D supplement to lower your risk of fractures. Be given hormone replacement therapy (HRT) to treat symptoms of menopause. Follow these instructions at home: Lifestyle Do not use any products that contain nicotine or tobacco, such as cigarettes, e-cigarettes, and chewing tobacco. If you need help quitting, ask your health care provider. Do not use street drugs. Do not share needles. Ask your health care provider for help if you need support or information about quitting drugs. Alcohol use Do not drink alcohol if: Your health care provider tells you not to drink. You are pregnant, may be pregnant, or are planning to become pregnant. If you drink alcohol: Limit how  much you use to 0-1 drink a day. Limit intake if you are breastfeeding. Be aware of how much alcohol is in your drink. In the [place] one drink equals one 12 oz bottle of beer (355 mL), one 5 oz glass of wine (148 mL), or one 1 oz glass of hard liquor (44 mL). General instructions Schedule regular health, dental, and eye exams. Stay current with your vaccines. Tell your health care provider if: You  often feel depressed. You have ever been abused or do not feel safe at home. Summary Adopting a healthy lifestyle and getting preventive care are important in promoting health and wellness. Follow your health care provider's instructions about healthy diet, exercising, and getting tested or screened for diseases. Follow your health care provider's instructions on monitoring your cholesterol and blood pressure. This information is not intended to replace advice given to you by your health care provider. Make sure you discuss any questions you have with your healthcare provider. Document Revised: 06/28/2018 Document Reviewed: 06/28/2018 Elsevier Patient Education  2022 [place], MD [facility]

## 2021-03-12 NOTE — Assessment & Plan Note (Signed)
Diet and nutrition discussed.  Advised to decrease amount of daily carbohydrate intake. 

## 2021-09-15 ENCOUNTER — Ambulatory Visit (INDEPENDENT_AMBULATORY_CARE_PROVIDER_SITE_OTHER): Payer: BC Managed Care – PPO | Admitting: Emergency Medicine

## 2021-09-15 ENCOUNTER — Encounter: Payer: Self-pay | Admitting: Emergency Medicine

## 2021-09-15 ENCOUNTER — Other Ambulatory Visit: Payer: Self-pay

## 2021-09-15 VITALS — BP 128/80 | HR 74 | Ht 65.0 in | Wt 222.0 lb

## 2021-09-15 DIAGNOSIS — Z130 Encounter for screening for diseases of the blood and blood-forming organs and certain disorders involving the immune mechanism: Secondary | ICD-10-CM

## 2021-09-15 DIAGNOSIS — Z1329 Encounter for screening for other suspected endocrine disorder: Secondary | ICD-10-CM

## 2021-09-15 DIAGNOSIS — Z0000 Encounter for general adult medical examination without abnormal findings: Secondary | ICD-10-CM

## 2021-09-15 DIAGNOSIS — Z13228 Encounter for screening for other metabolic disorders: Secondary | ICD-10-CM | POA: Diagnosis not present

## 2021-09-15 DIAGNOSIS — Z23 Encounter for immunization: Secondary | ICD-10-CM

## 2021-09-15 DIAGNOSIS — I10 Essential (primary) hypertension: Secondary | ICD-10-CM

## 2021-09-15 DIAGNOSIS — Z13220 Encounter for screening for lipoid disorders: Secondary | ICD-10-CM

## 2021-09-15 LAB — COMPREHENSIVE METABOLIC PANEL
ALT: 17 U/L (ref 0–35)
AST: 14 U/L (ref 0–37)
Albumin: 4.6 g/dL (ref 3.5–5.2)
Alkaline Phosphatase: 40 U/L (ref 39–117)
BUN: 11 mg/dL (ref 6–23)
CO2: 30 mEq/L (ref 19–32)
Calcium: 9.8 mg/dL (ref 8.4–10.5)
Chloride: 99 mEq/L (ref 96–112)
Creatinine, Ser: 0.77 mg/dL (ref 0.40–1.20)
GFR: 83.8 mL/min (ref >60.00)
Glucose, Bld: 93 mg/dL (ref 70–99)
Potassium: 4.3 mEq/L (ref 3.5–5.1)
Sodium: 137 mEq/L (ref 135–145)
Total Bilirubin: 0.5 mg/dL (ref 0.2–1.2)
Total Protein: 7.2 g/dL (ref 6.0–8.3)

## 2021-09-15 LAB — LIPID PANEL
Cholesterol: 261 mg/dL — ABNORMAL HIGH (ref 0–200)
HDL: 57.8 mg/dL (ref >39.00)
LDL Cholesterol: 168 mg/dL — ABNORMAL HIGH (ref 0–99)
NonHDL: 203.45
Total CHOL/HDL Ratio: 5
Triglycerides: 179 mg/dL — ABNORMAL HIGH (ref 0.0–149.0)
VLDL: 35.8 mg/dL (ref 0.0–40.0)

## 2021-09-15 LAB — CBC WITH DIFFERENTIAL/PLATELET
Basophils Absolute: 0.1 10*3/uL (ref 0.0–0.1)
Basophils Relative: 1.2 % (ref 0.0–3.0)
Eosinophils Absolute: 0.1 10*3/uL (ref 0.0–0.7)
Eosinophils Relative: 1.6 % (ref 0.0–5.0)
HCT: 37.9 % (ref 36.0–46.0)
Hemoglobin: 12.8 g/dL (ref 12.0–15.0)
Lymphocytes Relative: 47.1 % — ABNORMAL HIGH (ref 12.0–46.0)
Lymphs Abs: 3.5 10*3/uL (ref 0.7–4.0)
MCHC: 33.8 g/dL (ref 30.0–36.0)
MCV: 89 fl (ref 78.0–100.0)
Monocytes Absolute: 0.5 10*3/uL (ref 0.1–1.0)
Monocytes Relative: 7.1 % (ref 3.0–12.0)
Neutro Abs: 3.2 10*3/uL (ref 1.4–7.7)
Neutrophils Relative %: 43 % (ref 43.0–77.0)
Platelets: 232 10*3/uL (ref 150.0–400.0)
RBC: 4.26 Mil/uL (ref 3.87–5.11)
RDW: 12.4 % (ref 11.5–15.5)
WBC: 7.4 10*3/uL (ref 4.0–10.5)

## 2021-09-15 LAB — HEMOGLOBIN A1C: Hgb A1c MFr Bld: 6 % (ref 4.6–6.5)

## 2021-09-15 MED ORDER — VALACYCLOVIR HCL 1 G PO TABS: 1000.0000 mg | ORAL_TABLET | Freq: Two times a day (BID) | ORAL | 1 refills | Status: AC

## 2021-09-15 MED ORDER — LOSARTAN POTASSIUM-HCTZ 50-12.5 MG PO TABS: 1.0000 | ORAL_TABLET | Freq: Every day | ORAL | 3 refills | Status: DC

## 2021-09-15 NOTE — Progress Notes (Signed)
Renee Swanson 61 y.o.   Chief Complaint  Patient presents with   Annual Exam    Med refill of losartan and pt would like a prescription of valtrex for cold sores.    HISTORY OF PRESENT ILLNESS: This is a 61 y.o. female here for annual exam. Has history of hypertension on losartan. Doing well.  Requesting prescription of Valtrex for occasional cold sores. Has no other complaints or medical concerns today.  HPI   Prior to Admission medications   Medication Sig Start Date End Date Taking? Authorizing Provider  aspirin 81 MG tablet Take 81 mg by mouth daily.   Yes [provider]  Cholecalciferol (VITAMIN D PO) Take 1 capsule by mouth daily. 1000 units   Yes [provider]  Cyanocobalamin (VITAMIN B12 PO) Take 1,500 mcg by mouth.   Yes [provider]  meloxicam (MOBIC) 7.5 MG tablet Take 1 tablet (7.5 mg total) by mouth daily. 01/02/19  Yes Volanda Napoleon, PA-C  Multiple Vitamins-Minerals (CENTRUM PO) Take 1 tablet by mouth daily.   Yes [provider]  valACYclovir (VALTREX) 1000 MG tablet Take 1 tablet (1,000 mg total) by mouth 2 (two) times daily for 10 days. 09/15/21 09/25/21 Yes Genene Kilman, Ines Bloomer, MD  losartan-hydrochlorothiazide [facility]) 50-12.5 MG tablet Take 1 tablet by mouth daily. 09/15/21   Horald Pollen, MD    Allergies  Allergen Reactions   Sulfa Antibiotics Rash    Patient Active Problem List   Diagnosis Date Noted   Essential hypertension 12/10/2020   Prediabetes 12/10/2020    Past Medical History:  Diagnosis Date   Clotting disorder ([place])    changed BCP and it caused blood clots   Colon polyps    history of colon polyps   H/O blood clots 2012   leg   Heart murmur     Past Surgical History:  Procedure Laterality Date   COLONOSCOPY W/ POLYPECTOMY     NO PAST SURGERIES      Social History   Socioeconomic History   Marital status: Married    Spouse name: Not on file   Number of children: Not on  file   Years of education: Not on file   Highest education level: Not on file  Occupational History   Not on file  Tobacco Use   Smoking status: Never   Smokeless tobacco: Never  Substance and Sexual Activity   Alcohol use: No   Drug use: No   Sexual activity: Not on file  Other Topics Concern   Not on file  Social History Narrative   Not on file   Social Determinants of Health   Financial Resource Strain: Not on file  Food Insecurity: Not on file  Transportation Needs: Not on file  Physical Activity: Not on file  Stress: Not on file  Social Connections: Not on file  Intimate Partner Violence: Not on file    Family History  Problem Relation Age of Onset   Colon cancer Father 65     Review of Systems  Constitutional: Negative.  Negative for chills and fever.  HENT: Negative.  Negative for congestion and sore throat.   Respiratory: Negative.  Negative for cough and shortness of breath.   Cardiovascular: Negative.  Negative for chest pain and palpitations.  Gastrointestinal: Negative.  Negative for abdominal pain, diarrhea, nausea and vomiting.  Genitourinary: Negative.  Negative for dysuria and hematuria.  Musculoskeletal: Negative.   Skin: Negative.  Negative for rash.  Neurological: Negative.  Negative for dizziness and headaches.  All other systems reviewed and are negative.  Today's Vitals   09/15/21 1559  BP: 128/80  Pulse: 74  SpO2: 98%  Weight: 222 lb (100.7 kg)  Height: 5\' 5"  (1.651 m)   Body mass index is 36.94 kg/m.  Physical Exam Vitals reviewed.  Constitutional:      Appearance: Normal appearance.  HENT:     Head: Normocephalic.     Right Ear: Tympanic membrane, ear canal and external ear normal.     Left Ear: Tympanic membrane, ear canal and external ear normal.     Mouth/Throat:     Mouth: Mucous membranes are moist.     Pharynx: Oropharynx is clear.  Eyes:     Extraocular Movements: Extraocular movements intact.      Conjunctiva/sclera: Conjunctivae normal.     Pupils: Pupils are equal, round, and reactive to light.  Cardiovascular:     Rate and Rhythm: Normal rate and regular rhythm.     Pulses: Normal pulses.     Heart sounds: Normal heart sounds.  Pulmonary:     Effort: Pulmonary effort is normal.     Breath sounds: Normal breath sounds.  Abdominal:     General: There is no distension.     Palpations: Abdomen is soft.     Tenderness: There is no abdominal tenderness.  Musculoskeletal:        General: Normal range of motion.     Cervical back: Neck supple. No tenderness.  Lymphadenopathy:     Cervical: No cervical adenopathy.  Skin:    General: Skin is warm and dry.     Capillary Refill: Capillary refill takes less than 2 seconds.  Neurological:     General: No focal deficit present.     Mental Status: She is alert and oriented to person, place, and time.  Psychiatric:        Mood and Affect: Mood normal.        Behavior: Behavior normal.    ASSESSMENT & PLAN: Problem List Items Addressed This Visit       Cardiovascular and Mediastinum   Essential hypertension   Relevant Medications   losartan-hydrochlorothiazide (HYZAAR) 50-12.5 MG tablet   Other Visit Diagnoses     Routine general medical examination at a health care facility    -  Primary   Need for shingles vaccine       Relevant Orders   Varicella-zoster vaccine IM (Shingrix) (Completed)   Screening for deficiency anemia       Relevant Orders   CBC with Differential   Screening for lipoid disorders       Relevant Orders   Lipid panel   Screening for endocrine, metabolic and immunity disorder       Relevant Orders   Comprehensive metabolic panel   Hemoglobin A1c      Modifiable risk factors discussed with patient. Anticipatory guidance according to age provided. The following topics were also discussed: Social Determinants of Health Smoking.  Non-smoker Diet and nutrition Benefits of exercise Cancer screening  and review of most recent mammogram and colonoscopy reports Vaccinations recommendations Cardiovascular risk assessment Hypertension and management. Mental health including depression and anxiety Fall and accident prevention  Patient Instructions  Health Maintenance, Female Adopting a healthy lifestyle and getting preventive care are important in promoting health and wellness. Ask your health care provider about: The right schedule for you to have regular tests and exams. Things you can do on your own to prevent  diseases and keep yourself healthy. What should I know about diet, weight, and exercise? Eat a healthy diet  Eat a diet that includes plenty of vegetables, fruits, low-fat dairy products, and lean protein. Do not eat a lot of foods that are high in solid fats, added sugars, or sodium. Maintain a healthy weight Body mass index (BMI) is used to identify weight problems. It estimates body fat based on height and weight. Your health care provider can help determine your BMI and help you achieve or maintain a healthy weight. Get regular exercise Get regular exercise. This is one of the most important things you can do for your health. Most adults should: Exercise for at least 150 minutes each week. The exercise should increase your heart rate and make you sweat (moderate-intensity exercise). Do strengthening exercises at least twice a week. This is in addition to the moderate-intensity exercise. Spend less time sitting. Even light physical activity can be beneficial. Watch cholesterol and blood lipids Have your blood tested for lipids and cholesterol at 61 years of age, then have this test every 5 years. Have your cholesterol levels checked more often if: Your lipid or cholesterol levels are high. You are older than 61 years of age. You are at high risk for heart disease. What should I know about cancer screening? Depending on your health history and family history, you may need to  have cancer screening at various ages. This may include screening for: Breast cancer. Cervical cancer. Colorectal cancer. Skin cancer. Lung cancer. What should I know about heart disease, diabetes, and high blood pressure? Blood pressure and heart disease High blood pressure causes heart disease and increases the risk of stroke. This is more likely to develop in people who have high blood pressure readings or are overweight. Have your blood pressure checked: Every 3-5 years if you are 81-47 years of age. Every year if you are 84 years old or older. Diabetes Have regular diabetes screenings. This checks your fasting blood sugar level. Have the screening done: Once every three years after age 30 if you are at a normal weight and have a low risk for diabetes. More often and at a younger age if you are overweight or have a high risk for diabetes. What should I know about preventing infection? Hepatitis B If you have a higher risk for hepatitis B, you should be screened for this virus. Talk with your health care provider to find out if you are at risk for hepatitis B infection. Hepatitis C Testing is recommended for: Everyone born from [birth year] through 1965. Anyone with known risk factors for hepatitis C. Sexually transmitted infections (STIs) Get screened for STIs, including gonorrhea and chlamydia, if: You are sexually active and are younger than 61 years of age. You are older than 61 years of age and your health care provider tells you that you are at risk for this type of infection. Your sexual activity has changed since you were last screened, and you are at increased risk for chlamydia or gonorrhea. Ask your health care provider if you are at risk. Ask your health care provider about whether you are at high risk for HIV. Your health care provider may recommend a prescription medicine to help prevent HIV infection. If you choose to take medicine to prevent HIV, you should first get tested for  HIV. You should then be tested every 3 months for as long as you are taking the medicine. Pregnancy If you are about to stop having your  period (premenopausal) and you may become pregnant, seek counseling before you get pregnant. Take 400 to 800 micrograms (mcg) of folic acid every day if you become pregnant. Ask for birth control (contraception) if you want to prevent pregnancy. Osteoporosis and menopause Osteoporosis is a disease in which the bones lose minerals and strength with aging. This can result in bone fractures. If you are 62 years old or older, or if you are at risk for osteoporosis and fractures, ask your health care provider if you should: Be screened for bone loss. Take a calcium or vitamin D supplement to lower your risk of fractures. Be given hormone replacement therapy (HRT) to treat symptoms of menopause. Follow these instructions at home: Alcohol use Do not drink alcohol if: Your health care provider tells you not to drink. You are pregnant, may be pregnant, or are planning to become pregnant. If you drink alcohol: Limit how much you have to: 0-1 drink a day. Know how much alcohol is in your drink. In the [place] one drink equals one 12 oz bottle of beer (355 mL), one 5 oz glass of wine (148 mL), or one 1 oz glass of hard liquor (44 mL). Lifestyle Do not use any products that contain nicotine or tobacco. These products include cigarettes, chewing tobacco, and vaping devices, such as e-cigarettes. If you need help quitting, ask your health care provider. Do not use street drugs. Do not share needles. Ask your health care provider for help if you need support or information about quitting drugs. General instructions Schedule regular health, dental, and eye exams. Stay current with your vaccines. Tell your health care provider if: You often feel depressed. You have ever been abused or do not feel safe at home. Summary Adopting a healthy lifestyle and getting preventive  care are important in promoting health and wellness. Follow your health care provider's instructions about healthy diet, exercising, and getting tested or screened for diseases. Follow your health care provider's instructions on monitoring your cholesterol and blood pressure. This information is not intended to replace advice given to you by your health care provider. Make sure you discuss any questions you have with your health care provider. Document Revised: 11/24/2020 Document Reviewed: 11/24/2020 Elsevier Patient Education  2022 [place], MD [facility]

## 2021-09-15 NOTE — Patient Instructions (Signed)

## 2021-09-16 ENCOUNTER — Other Ambulatory Visit: Payer: Self-pay | Admitting: Emergency Medicine

## 2021-09-16 DIAGNOSIS — E785 Hyperlipidemia, unspecified: Secondary | ICD-10-CM

## 2021-09-16 MED ORDER — ROSUVASTATIN CALCIUM 10 MG PO TABS: 10.0000 mg | ORAL_TABLET | Freq: Every day | ORAL | 3 refills | Status: DC

## 2021-09-16 NOTE — Progress Notes (Signed)
Lab Results  ?Component Value Date  ? CHOL 261 (H) 09/15/2021  ? HDL 57.80 09/15/2021  ? LDLCALC 168 (H) 09/15/2021  ? TRIG 179.0 (H) 09/15/2021  ? CHOLHDL 5 09/15/2021  ? ?The 10-year ASCVD risk score (Arnett DK, et al., 2019) is: 5.3% ?  Values used to calculate the score: ?    Age: 61 years ?    Sex: Female ?    Is Non-Hispanic African American: No ?    Diabetic: No ?    Tobacco smoker: No ?    Systolic Blood Pressure: 500 mmHg ?    Is BP treated: Yes ?    HDL Cholesterol: 57.8 mg/dL ?    Total Cholesterol: 261 mg/dL ?Recommend to start Crestor 10 mg daily. ?

## 2021-09-20 NOTE — Progress Notes (Signed)
It is okay for patient to come back and have a fasting lipid profile test.

## 2021-09-23 ENCOUNTER — Telehealth: Payer: Self-pay | Admitting: Emergency Medicine

## 2021-09-23 DIAGNOSIS — E785 Hyperlipidemia, unspecified: Secondary | ICD-10-CM

## 2021-09-23 NOTE — Telephone Encounter (Signed)
Pt states cma was going to contact provider for advice before she started taking rosuvastatin (CRESTOR) 10 MG tablet ? ?Pt requesting a c/b ?

## 2021-09-24 NOTE — Telephone Encounter (Signed)
Called, spoke with pt about fasting lipid order. Schedule lab appt for 09/30/21 at 8:00am. ?

## 2021-09-30 ENCOUNTER — Other Ambulatory Visit (INDEPENDENT_AMBULATORY_CARE_PROVIDER_SITE_OTHER): Payer: BC Managed Care – PPO

## 2021-09-30 ENCOUNTER — Other Ambulatory Visit: Payer: Self-pay

## 2021-09-30 DIAGNOSIS — E785 Hyperlipidemia, unspecified: Secondary | ICD-10-CM | POA: Diagnosis not present

## 2021-09-30 LAB — LIPID PANEL
Cholesterol: 227 mg/dL — ABNORMAL HIGH (ref 0–200)
HDL: 53.7 mg/dL (ref >39.00)
LDL Cholesterol: 134 mg/dL — ABNORMAL HIGH (ref 0–99)
NonHDL: 173.71
Total CHOL/HDL Ratio: 4
Triglycerides: 198 mg/dL — ABNORMAL HIGH (ref 0.0–149.0)
VLDL: 39.6 mg/dL (ref 0.0–40.0)

## 2022-01-28 DIAGNOSIS — Z6836 Body mass index (BMI) 36.0-36.9, adult: Secondary | ICD-10-CM | POA: Diagnosis not present

## 2022-01-28 DIAGNOSIS — Z1231 Encounter for screening mammogram for malignant neoplasm of breast: Secondary | ICD-10-CM | POA: Diagnosis not present

## 2022-01-28 DIAGNOSIS — Z01419 Encounter for gynecological examination (general) (routine) without abnormal findings: Secondary | ICD-10-CM | POA: Diagnosis not present

## 2022-03-11 ENCOUNTER — Ambulatory Visit: Payer: BC Managed Care – PPO | Admitting: Emergency Medicine

## 2022-03-11 ENCOUNTER — Encounter: Payer: Self-pay | Admitting: Emergency Medicine

## 2022-03-11 VITALS — BP 124/68 | HR 65 | Temp 97.9°F | Ht 65.0 in | Wt 219.0 lb

## 2022-03-11 DIAGNOSIS — E785 Hyperlipidemia, unspecified: Secondary | ICD-10-CM | POA: Diagnosis not present

## 2022-03-11 DIAGNOSIS — R7303 Prediabetes: Secondary | ICD-10-CM | POA: Diagnosis not present

## 2022-03-11 DIAGNOSIS — I10 Essential (primary) hypertension: Secondary | ICD-10-CM | POA: Diagnosis not present

## 2022-03-11 NOTE — Assessment & Plan Note (Signed)
Well-controlled hypertension. Cardiovascular risk associated with hypertension discussed. Dietary approaches to stop hypertension discussed. Continue losartan/HCTZ 50- 12.5 mg daily. BP Readings from Last 3 Encounters:  03/11/22 124/68  09/15/21 128/80  03/12/21 138/86

## 2022-03-11 NOTE — Assessment & Plan Note (Signed)
Diet and nutrition discussed.  Advised to decrease amount of daily carbohydrate intake and daily calories and increase amount of plant-based protein in her diet. 

## 2022-03-11 NOTE — Patient Instructions (Signed)

## 2022-03-11 NOTE — Progress Notes (Signed)
Renee Swanson 61 y.o.   Chief Complaint  Patient presents with   Follow-up    22mh f/u appt , no concerns     HISTORY OF PRESENT ILLNESS: This is a 61y.o. female here for 643-monthollow-up of chronic medical problems. Doing well.  Has no complaints or medical concerns today.  HPI   Prior to Admission medications   Medication Sig Start Date End Date Taking? Authorizing Provider  aspirin 81 MG tablet Take 81 mg by mouth daily.   Yes [provider]  Cholecalciferol (VITAMIN D PO) Take 1 capsule by mouth daily. 1000 units   Yes [provider]  Cyanocobalamin (VITAMIN B12 PO) Take 1,500 mcg by mouth.   Yes [provider]  hydrochlorothiazide (HYDRODIURIL) 25 MG tablet Take 1 tablet by mouth daily.   Yes [provider]  losartan-hydrochlorothiazide (HYZAAR) 50-12.5 MG tablet Take 1 tablet by mouth daily. 09/15/21  Yes Kaisey Huseby, MiInes BloomerMD  Multiple Vitamins-Minerals (CENTRUM PO) Take 1 tablet by mouth daily.   Yes [provider]  rosuvastatin (CRESTOR) 10 MG tablet Take 1 tablet (10 mg total) by mouth daily. 09/16/21  Yes SaHorald PollenMD    Allergies  Allergen Reactions   Sulfa Antibiotics Rash    Patient Active Problem List   Diagnosis Date Noted   Dyslipidemia 09/16/2021   Essential hypertension 12/10/2020   Prediabetes 12/10/2020    Past Medical History:  Diagnosis Date   Clotting disorder ([facility]   changed BCP and it caused blood clots   Colon polyps    history of colon polyps   H/O blood clots 2012   leg   Heart murmur     Past Surgical History:  Procedure Laterality Date   COLONOSCOPY W/ POLYPECTOMY     NO PAST SURGERIES      Social History   Socioeconomic History   Marital status: Married    Spouse name: Not on file   Number of children: Not on file   Years of education: Not on file   Highest education level: Not on file  Occupational History   Not on file  Tobacco Use   Smoking  status: Never   Smokeless tobacco: Never  Substance and Sexual Activity   Alcohol use: No   Drug use: No   Sexual activity: Not on file  Other Topics Concern   Not on file  Social History Narrative   Not on file   Social Determinants of Health   Financial Resource Strain: Not on file  Food Insecurity: Not on file  Transportation Needs: Not on file  Physical Activity: Not on file  Stress: Not on file  Social Connections: Not on file  Intimate Partner Violence: Not on file    Family History  Problem Relation Age of Onset   Colon cancer Father 6050   Review of Systems  Constitutional: Negative.  Negative for chills and fever.  HENT: Negative.  Negative for congestion and sore throat.   Respiratory: Negative.  Negative for cough and shortness of breath.   Cardiovascular: Negative.  Negative for chest pain and palpitations.  Gastrointestinal: Negative.  Negative for abdominal pain, diarrhea, nausea and vomiting.  Genitourinary: Negative.  Negative for dysuria and hematuria.  Musculoskeletal: Negative.   Skin: Negative.  Negative for rash.  Neurological:  Negative for dizziness and headaches.  All other systems reviewed and are negative.  Today's Vitals   03/11/22 1559  BP: 124/68  Pulse: 65  Temp: 97.9 F (36.6 C)  TempSrc: Oral  SpO2: 95%  Weight: 219 lb (99.3 kg)  Height: '5\' 5"'$  (1.651 m)   Body mass index is 36.44 kg/m.   Physical Exam Vitals reviewed.  Constitutional:      Appearance: Normal appearance.  HENT:     Head: Normocephalic.     Mouth/Throat:     Mouth: Mucous membranes are moist.     Pharynx: Oropharynx is clear.  Eyes:     Extraocular Movements: Extraocular movements intact.     Conjunctiva/sclera: Conjunctivae normal.     Pupils: Pupils are equal, round, and reactive to light.  Cardiovascular:     Rate and Rhythm: Normal rate and regular rhythm.     Pulses: Normal pulses.     Heart sounds: Normal heart sounds.  Pulmonary:     Effort:  Pulmonary effort is normal.     Breath sounds: Normal breath sounds.  Abdominal:     General: There is no distension.     Palpations: Abdomen is soft.     Tenderness: There is no abdominal tenderness.  Musculoskeletal:     Cervical back: No tenderness.  Lymphadenopathy:     Cervical: No cervical adenopathy.  Skin:    General: Skin is warm and dry.     Capillary Refill: Capillary refill takes less than 2 seconds.  Neurological:     General: No focal deficit present.     Mental Status: She is alert and oriented to person, place, and time.  Psychiatric:        Mood and Affect: Mood normal.        Behavior: Behavior normal.      ASSESSMENT & PLAN: Problem List Items Addressed This Visit       Cardiovascular and Mediastinum   Essential hypertension - Primary    Well-controlled hypertension. Cardiovascular risk associated with hypertension discussed. Dietary approaches to stop hypertension discussed. Continue losartan/HCTZ 50- 12.5 mg daily. BP Readings from Last 3 Encounters:  03/11/22 124/68  09/15/21 128/80  03/12/21 138/86           Other   Prediabetes    Diet and nutrition discussed. Advised to decrease amount of daily carbohydrate intake and daily calories and increase amount of plant based protein in her diet.      Dyslipidemia    Stable.  Diet and nutrition discussed. Continue rosuvastatin 10 mg daily. The 10-year ASCVD risk score (Arnett DK, et al., 2019) is: 4.7%   Values used to calculate the score:     Age: 70 years     Sex: Female     Is Non-Hispanic African American: No     Diabetic: No     Tobacco smoker: No     Systolic Blood Pressure: 027 mmHg     Is BP treated: Yes     HDL Cholesterol: 53.7 mg/dL     Total Cholesterol: 227 mg/dL       Patient Instructions  Health Maintenance, Female Adopting a healthy lifestyle and getting preventive care are important in promoting health and wellness. Ask your health care provider about: The right  schedule for you to have regular tests and exams. Things you can do on your own to prevent diseases and keep yourself healthy. What should I know about diet, weight, and exercise? Eat a healthy diet  Eat a diet that includes plenty of vegetables, fruits, low-fat dairy products, and lean protein. Do not eat a lot of foods that are high in solid  fats, added sugars, or sodium. Maintain a healthy weight Body mass index (BMI) is used to identify weight problems. It estimates body fat based on height and weight. Your health care provider can help determine your BMI and help you achieve or maintain a healthy weight. Get regular exercise Get regular exercise. This is one of the most important things you can do for your health. Most adults should: Exercise for at least 150 minutes each week. The exercise should increase your heart rate and make you sweat (moderate-intensity exercise). Do strengthening exercises at least twice a week. This is in addition to the moderate-intensity exercise. Spend less time sitting. Even light physical activity can be beneficial. Watch cholesterol and blood lipids Have your blood tested for lipids and cholesterol at 61 years of age, then have this test every 5 years. Have your cholesterol levels checked more often if: Your lipid or cholesterol levels are high. You are older than 61 years of age. You are at high risk for heart disease. What should I know about cancer screening? Depending on your health history and family history, you may need to have cancer screening at various ages. This may include screening for: Breast cancer. Cervical cancer. Colorectal cancer. Skin cancer. Lung cancer. What should I know about heart disease, diabetes, and high blood pressure? Blood pressure and heart disease High blood pressure causes heart disease and increases the risk of stroke. This is more likely to develop in people who have high blood pressure readings or are  overweight. Have your blood pressure checked: Every 3-5 years if you are 37-82 years of age. Every year if you are 50 years old or older. Diabetes Have regular diabetes screenings. This checks your fasting blood sugar level. Have the screening done: Once every three years after age 30 if you are at a normal weight and have a low risk for diabetes. More often and at a younger age if you are overweight or have a high risk for diabetes. What should I know about preventing infection? Hepatitis B If you have a higher risk for hepatitis B, you should be screened for this virus. Talk with your health care provider to find out if you are at risk for hepatitis B infection. Hepatitis C Testing is recommended for: Everyone born from [birth year] through 1965. Anyone with known risk factors for hepatitis C. Sexually transmitted infections (STIs) Get screened for STIs, including gonorrhea and chlamydia, if: You are sexually active and are younger than 61 years of age. You are older than 61 years of age and your health care provider tells you that you are at risk for this type of infection. Your sexual activity has changed since you were last screened, and you are at increased risk for chlamydia or gonorrhea. Ask your health care provider if you are at risk. Ask your health care provider about whether you are at high risk for HIV. Your health care provider may recommend a prescription medicine to help prevent HIV infection. If you choose to take medicine to prevent HIV, you should first get tested for HIV. You should then be tested every 3 months for as long as you are taking the medicine. Pregnancy If you are about to stop having your period (premenopausal) and you may become pregnant, seek counseling before you get pregnant. Take 400 to 800 micrograms (mcg) of folic acid every day if you become pregnant. Ask for birth control (contraception) if you want to prevent pregnancy. Osteoporosis and  menopause Osteoporosis is a disease  in which the bones lose minerals and strength with aging. This can result in bone fractures. If you are 66 years old or older, or if you are at risk for osteoporosis and fractures, ask your health care provider if you should: Be screened for bone loss. Take a calcium or vitamin D supplement to lower your risk of fractures. Be given hormone replacement therapy (HRT) to treat symptoms of menopause. Follow these instructions at home: Alcohol use Do not drink alcohol if: Your health care provider tells you not to drink. You are pregnant, may be pregnant, or are planning to become pregnant. If you drink alcohol: Limit how much you have to: 0-1 drink a day. Know how much alcohol is in your drink. In the [place] one drink equals one 12 oz bottle of beer (355 mL), one 5 oz glass of wine (148 mL), or one 1 oz glass of hard liquor (44 mL). Lifestyle Do not use any products that contain nicotine or tobacco. These products include cigarettes, chewing tobacco, and vaping devices, such as e-cigarettes. If you need help quitting, ask your health care provider. Do not use street drugs. Do not share needles. Ask your health care provider for help if you need support or information about quitting drugs. General instructions Schedule regular health, dental, and eye exams. Stay current with your vaccines. Tell your health care provider if: You often feel depressed. You have ever been abused or do not feel safe at home. Summary Adopting a healthy lifestyle and getting preventive care are important in promoting health and wellness. Follow your health care provider's instructions about healthy diet, exercising, and getting tested or screened for diseases. Follow your health care provider's instructions on monitoring your cholesterol and blood pressure. This information is not intended to replace advice given to you by your health care provider. Make sure you discuss any  questions you have with your health care provider. Document Revised: 11/24/2020 Document Reviewed: 11/24/2020 Elsevier Patient Education  [place], MD [facility]

## 2022-03-11 NOTE — Assessment & Plan Note (Signed)
Stable.  Diet and nutrition discussed. Continue rosuvastatin 10 mg daily. The 10-year ASCVD risk score (Arnett DK, et al., 2019) is: 4.7%   Values used to calculate the score:     Age: 61 years     Sex: Female     Is Non-Hispanic African American: No     Diabetic: No     Tobacco smoker: No     Systolic Blood Pressure: 010 mmHg     Is BP treated: Yes     HDL Cholesterol: 53.7 mg/dL     Total Cholesterol: 227 mg/dL

## 2022-03-16 ENCOUNTER — Ambulatory Visit: Payer: BC Managed Care – PPO | Admitting: Emergency Medicine

## 2022-05-14 ENCOUNTER — Encounter: Payer: Self-pay | Admitting: Gastroenterology

## 2022-06-18 ENCOUNTER — Ambulatory Visit (AMBULATORY_SURGERY_CENTER): Payer: BC Managed Care – PPO

## 2022-06-18 VITALS — Ht 65.0 in | Wt 224.0 lb

## 2022-06-18 DIAGNOSIS — Z86010 Personal history of colonic polyps: Secondary | ICD-10-CM

## 2022-06-18 DIAGNOSIS — Z83719 Family history of colon polyps, unspecified: Secondary | ICD-10-CM

## 2022-06-18 MED ORDER — NA SULFATE-K SULFATE-MG SULF 17.5-3.13-1.6 GM/177ML PO SOLN: 1.0000 | Freq: Once | ORAL | 0 refills | Status: AC

## 2022-06-18 NOTE — Progress Notes (Signed)

## 2022-06-29 ENCOUNTER — Encounter: Payer: Self-pay | Admitting: Gastroenterology

## 2022-07-05 ENCOUNTER — Encounter: Payer: Self-pay | Admitting: Gastroenterology

## 2022-07-05 ENCOUNTER — Ambulatory Visit (AMBULATORY_SURGERY_CENTER): Payer: BC Managed Care – PPO | Admitting: Gastroenterology

## 2022-07-05 VITALS — BP 130/74 | HR 58 | Temp 97.7°F | Resp 14 | Ht 65.0 in | Wt 224.0 lb

## 2022-07-05 DIAGNOSIS — K640 First degree hemorrhoids: Secondary | ICD-10-CM

## 2022-07-05 DIAGNOSIS — Z1211 Encounter for screening for malignant neoplasm of colon: Secondary | ICD-10-CM

## 2022-07-05 DIAGNOSIS — Z800 Family history of malignant neoplasm of digestive organs: Secondary | ICD-10-CM

## 2022-07-05 DIAGNOSIS — Z86010 Personal history of colonic polyps: Secondary | ICD-10-CM

## 2022-07-05 DIAGNOSIS — Z09 Encounter for follow-up examination after completed treatment for conditions other than malignant neoplasm: Secondary | ICD-10-CM | POA: Diagnosis not present

## 2022-07-05 MED ORDER — SODIUM CHLORIDE 0.9 % IV SOLN: 500.0000 mL | Freq: Once | INTRAVENOUS | Status: DC

## 2022-07-05 NOTE — Progress Notes (Signed)
VS completed by DT.  Pt's states no medical or surgical changes since previsit or office visit.  

## 2022-07-05 NOTE — Progress Notes (Signed)
History & Physical  Primary Care Physician:  Horald Pollen, MD Primary Gastroenterologist: Lucio Edward, MD  CHIEF COMPLAINT:  CRC screening, Personal history of colon polyps   HPI: Renee Swanson is a 61 y.o. female with a personal history of adenomatous colon polyps, family history of colon cancer, first-degree relative, for colonoscopy.    Past Medical History:  Diagnosis Date   Clotting disorder ([place])    changed BCP and it caused blood clots   Colon polyps    history of colon polyps   H/O blood clots 2012   leg   Heart murmur    Hyperlipidemia    Hypertension     Past Surgical History:  Procedure Laterality Date   COLONOSCOPY W/ POLYPECTOMY     NO PAST SURGERIES      Prior to Admission medications   Medication Sig Start Date End Date Taking? Authorizing Provider  aspirin 81 MG tablet Take 81 mg by mouth daily.   Yes [provider]  Cholecalciferol (VITAMIN D PO) Take 1 capsule by mouth daily. 1000 units   Yes [provider]  Cyanocobalamin (VITAMIN B12 PO) Take 1,500 mcg by mouth.   Yes [provider]  losartan-hydrochlorothiazide (HYZAAR) 50-12.5 MG tablet Take 1 tablet by mouth daily. 09/15/21  Yes Sagardia, Ines Bloomer, MD  Multiple Vitamins-Minerals (CENTRUM PO) Take 1 tablet by mouth daily.   Yes [provider]  rosuvastatin (CRESTOR) 10 MG tablet Take 1 tablet (10 mg total) by mouth daily. 09/16/21  Yes SagardiaInes Bloomer, MD    Current Outpatient Medications  Medication Sig Dispense Refill   aspirin 81 MG tablet Take 81 mg by mouth daily.     Cholecalciferol (VITAMIN D PO) Take 1 capsule by mouth daily. 1000 units     Cyanocobalamin (VITAMIN B12 PO) Take 1,500 mcg by mouth.     losartan-hydrochlorothiazide (HYZAAR) 50-12.5 MG tablet Take 1 tablet by mouth daily. 90 tablet 3   Multiple Vitamins-Minerals (CENTRUM PO) Take 1 tablet by mouth daily.     rosuvastatin (CRESTOR) 10 MG tablet Take 1 tablet (10 mg  total) by mouth daily. 90 tablet 3   Current Facility-Administered Medications  Medication Dose Route Frequency Provider Last Rate Last Admin   0.9 %  sodium chloride infusion  500 mL Intravenous Once Ladene Artist, MD        Allergies as of 07/05/2022 - Review Complete 07/05/2022  Allergen Reaction Noted   Sulfa antibiotics Rash 02/07/2014    Family History  Problem Relation Age of Onset   Colon polyps Father    Colon cancer Father 58   Esophageal cancer Neg Hx    Rectal cancer Neg Hx    Stomach cancer Neg Hx     Social History   Socioeconomic History   Marital status: Married    Spouse name: Not on file   Number of children: Not on file   Years of education: Not on file   Highest education level: Not on file  Occupational History   Not on file  Tobacco Use   Smoking status: Never   Smokeless tobacco: Never  Vaping Use   Vaping Use: Never used  Substance and Sexual Activity   Alcohol use: No   Drug use: No   Sexual activity: Not on file  Other Topics Concern   Not on file  Social History Narrative   Not on file   Social Determinants of Health   Financial Resource Strain: Not on  file  Food Insecurity: Not on file  Transportation Needs: Not on file  Physical Activity: Not on file  Stress: Not on file  Social Connections: Not on file  Intimate Partner Violence: Not on file    Review of Systems:  All systems reviewed were negative except where noted in HPI.   Physical Exam: Vital signs in last 24 hours: '@VSRANGES'$ @   General:  Alert, well-developed, in NAD Head:  Normocephalic and atraumatic. Eyes:  Sclera clear, no icterus.   Conjunctiva pink. Ears:  Normal auditory acuity. Mouth:  No deformity or lesions.  Neck:  Supple; no masses . Lungs:  Clear throughout to auscultation.   No wheezes, crackles, or rhonchi. No acute distress. Heart:  Regular rate and rhythm; no murmurs. Abdomen:  Soft, nondistended, nontender. No masses, hepatomegaly. No  obvious masses.  Normal bowel .    Rectal:  Deferred   Msk:  Symmetrical without gross deformities.. Pulses:  Normal pulses noted. Extremities:  Without edema. Neurologic:  Alert and  oriented x4;  grossly normal neurologically. Skin:  Intact without significant lesions or rashes. Psych:  Alert and cooperative. Normal mood and affect.  Impression / Swanson:   Personal history of adenomatous colon polyps, family history of colon cancer, first-degree relative, for colonoscopy.  Renee Swanson. Renee Swanson  07/05/2022, 8:27 AM See Shea Evans, [facility], to contact our on call provider

## 2022-07-05 NOTE — Progress Notes (Signed)
Report given to PACU, vss 

## 2022-07-05 NOTE — Patient Instructions (Addendum)
-   Repeat colonoscopy in 5 years for surveillance. - Patient has a contact number available for emergencies. The signs and symptoms of potential delayed complications were discussed with the patient. Return to normal activities tomorrow. Written discharge instructions were provided to the patient. - Resume previous diet. - Continue present medications.  YOU HAD AN ENDOSCOPIC PROCEDURE TODAY AT [facility]:   Refer to the procedure report that was given to you for any specific questions about what was found during the examination.  If the procedure report does not answer your questions, please call your gastroenterologist to clarify.  If you requested that your care partner not be given the details of your procedure findings, then the procedure report has been included in a sealed envelope for you to review at your convenience later.  YOU SHOULD EXPECT: Some feelings of bloating in the abdomen. Passage of more gas than usual.  Walking can help get rid of the air that was put into your GI tract during the procedure and reduce the bloating. If you had a lower endoscopy (such as a colonoscopy or flexible sigmoidoscopy) you may notice spotting of blood in your stool or on the toilet paper. If you underwent a bowel prep for your procedure, you may not have a normal bowel movement for a few days.  Please Note:  You might notice some irritation and congestion in your nose or some drainage.  This is from the oxygen used during your procedure.  There is no need for concern and it should clear up in a day or so.  SYMPTOMS TO REPORT IMMEDIATELY:  Following lower endoscopy (colonoscopy or flexible sigmoidoscopy):  Excessive amounts of blood in the stool  Significant tenderness or worsening of abdominal pains  Swelling of the abdomen that is new, acute  Fever of 100F or higher For urgent or emergent issues, a gastroenterologist can be reached at any hour by calling [phone]. Do not use  MyChart messaging for urgent concerns.    DIET:  We do recommend a small meal at first, but then you may proceed to your regular diet.  Drink plenty of fluids but you should avoid alcoholic beverages for 24 hours.  ACTIVITY:  You should plan to take it easy for the rest of today and you should NOT DRIVE or use heavy machinery until tomorrow (because of the sedation medicines used during the test).    FOLLOW UP: Our staff will call the number listed on your records the next business day following your procedure.  We will call around 7:15- 8:00 am to check on you and address any questions or concerns that you may have regarding the information given to you following your procedure. If we do not reach you, we will leave a message.      SIGNATURES/CONFIDENTIALITY: You and/or your care partner have signed paperwork which will be entered into your electronic medical record.  These signatures attest to the fact that that the information above on your After Visit Summary has been reviewed and is understood.  Full responsibility of the confidentiality of this discharge information lies with you and/or your care-partner.

## 2022-07-05 NOTE — Op Note (Signed)
[facility] Patient Name: Renee Swanson Procedure Date: 07/05/2022 8:09 AM MRN: [id number] Endoscopist: Ladene Artist , MD, [id number] Age: 61 Referring MD:  Date of Birth: 14-Mar-1961 Gender: Female Account #: [id number] Procedure:                Colonoscopy Indications:              Surveillance: Personal history of adenomatous                            polyps on last colonoscopy 5 years ago, Family                            history of colon cancer, 1st-degree relative Medicines:                Monitored Anesthesia Care Procedure:                Pre-Anesthesia Assessment:                           - Prior to the procedure, a History and Physical                            was performed, and patient medications and                            allergies were reviewed. The patient's tolerance of                            previous anesthesia was also reviewed. The risks                            and benefits of the procedure and the sedation                            options and risks were discussed with the patient.                            All questions were answered, and informed consent                            was obtained. Prior Anticoagulants: The patient has                            taken no anticoagulant or antiplatelet agents. ASA                            Grade Assessment: II - A patient with mild systemic                            disease. After reviewing the risks and benefits,                            the patient was deemed in satisfactory condition to  undergo the procedure.                           After obtaining informed consent, the colonoscope                            was passed under direct vision. Throughout the                            procedure, the patient's blood pressure, pulse, and                            oxygen saturations were monitored continuously. The                            Olympus  [id number]-HQ190L [id number]) Colonoscope was                            introduced through the anus and advanced to the the                            cecum, identified by appendiceal orifice and                            ileocecal valve. The ileocecal valve, appendiceal                            orifice, and rectum were photographed. The quality                            of the bowel preparation was excellent. The                            colonoscopy was performed without difficulty. The                            patient tolerated the procedure well. Scope In: 8:51:37 AM Scope Out: 9:03:14 AM Scope Withdrawal Time: 0 hours 9 minutes 44 seconds  Total Procedure Duration: 0 hours 11 minutes 37 seconds  Findings:                 The perianal and digital rectal examinations were                            normal.                           Internal hemorrhoids were found during                            retroflexion. The hemorrhoids were small and Grade                            I (internal hemorrhoids that do not prolapse).  The exam was otherwise without abnormality on                            direct and retroflexion views. Complications:            No immediate complications. Estimated blood loss:                            None. Estimated Blood Loss:     Estimated blood loss: none. Impression:               - Internal hemorrhoids.                           - The examination was otherwise normal on direct                            and retroflexion views.                           - No specimens collected. Recommendation:           - Repeat colonoscopy in 5 years for surveillance.                           - Patient has a contact number available for                            emergencies. The signs and symptoms of potential                            delayed complications were discussed with the                            patient. Return to normal activities  tomorrow.                            Written discharge instructions were provided to the                            patient.                           - Resume previous diet.                           - Continue present medications. Ladene Artist, MD 07/05/2022 9:05:59 AM This report has been signed electronically.

## 2022-07-06 ENCOUNTER — Telehealth: Payer: Self-pay

## 2022-07-06 NOTE — Telephone Encounter (Signed)
  Follow up Call-     07/05/2022    8:16 AM  Call back number  Post procedure Call Back phone  # [phone]  Permission to leave phone message Yes     Patient questions:  Do you have a fever, pain , or abdominal swelling? No. Pain Score  0 *  Have you tolerated food without any problems? Yes.    Have you been able to return to your normal activities? Yes.    Do you have any questions about your discharge instructions: Diet   No. Medications  No. Follow up visit  No.  Do you have questions or concerns about your Care? No.  Actions: * If pain score is 4 or above: No action needed, pain <4.

## 2022-09-08 ENCOUNTER — Ambulatory Visit (INDEPENDENT_AMBULATORY_CARE_PROVIDER_SITE_OTHER): Payer: BC Managed Care – PPO | Admitting: Emergency Medicine

## 2022-09-08 ENCOUNTER — Encounter: Payer: Self-pay | Admitting: Emergency Medicine

## 2022-09-08 VITALS — BP 130/76 | HR 63 | Temp 97.6°F | Ht 65.0 in | Wt 224.5 lb

## 2022-09-08 DIAGNOSIS — E785 Hyperlipidemia, unspecified: Secondary | ICD-10-CM | POA: Diagnosis not present

## 2022-09-08 DIAGNOSIS — I10 Essential (primary) hypertension: Secondary | ICD-10-CM | POA: Diagnosis not present

## 2022-09-08 DIAGNOSIS — R7303 Prediabetes: Secondary | ICD-10-CM

## 2022-09-08 LAB — CBC WITH DIFFERENTIAL/PLATELET
Basophils Absolute: 0.1 10*3/uL (ref 0.0–0.1)
Basophils Relative: 0.9 % (ref 0.0–3.0)
Eosinophils Absolute: 0.1 10*3/uL (ref 0.0–0.7)
Eosinophils Relative: 1.4 % (ref 0.0–5.0)
HCT: 39.5 % (ref 36.0–46.0)
Hemoglobin: 13.4 g/dL (ref 12.0–15.0)
Lymphocytes Relative: 48.9 % — ABNORMAL HIGH (ref 12.0–46.0)
Lymphs Abs: 3.5 10*3/uL (ref 0.7–4.0)
MCHC: 33.8 g/dL (ref 30.0–36.0)
MCV: 89.5 fl (ref 78.0–100.0)
Monocytes Absolute: 0.5 10*3/uL (ref 0.1–1.0)
Monocytes Relative: 7.6 % (ref 3.0–12.0)
Neutro Abs: 2.9 10*3/uL (ref 1.4–7.7)
Neutrophils Relative %: 41.2 % — ABNORMAL LOW (ref 43.0–77.0)
Platelets: 229 10*3/uL (ref 150.0–400.0)
RBC: 4.42 Mil/uL (ref 3.87–5.11)
RDW: 12.6 % (ref 11.5–15.5)
WBC: 7.1 10*3/uL (ref 4.0–10.5)

## 2022-09-08 LAB — HEMOGLOBIN A1C: Hgb A1c MFr Bld: 6.1 % (ref 4.6–6.5)

## 2022-09-08 MED ORDER — LOSARTAN POTASSIUM-HCTZ 50-12.5 MG PO TABS: 1.0000 | ORAL_TABLET | Freq: Every day | ORAL | 3 refills | Status: DC

## 2022-09-08 MED ORDER — ROSUVASTATIN CALCIUM 10 MG PO TABS: 10.0000 mg | ORAL_TABLET | Freq: Every day | ORAL | 3 refills | Status: DC

## 2022-09-08 NOTE — Assessment & Plan Note (Signed)
Diet and nutrition discussed Hemoglobin A1c done today. Advised to decrease amount of daily carbohydrate intake and daily calories and increase amount of plant-based protein in her diet.

## 2022-09-08 NOTE — Progress Notes (Signed)
Renee Swanson 62 y.o.   Chief Complaint  Patient presents with   Follow-up    34mth f/u appt, no concerns     HISTORY OF PRESENT ILLNESS: This is a 62y.o. female here for 634-monthollow-up of hypertension and dyslipidemia. Overall doing well.  Has no complaints or medical concerns today. States that she is up-to-date with mammograms and gets them yearly.  BP Readings from Last 3 Encounters:  09/08/22 130/76  07/05/22 130/74  03/11/22 124/68   Wt Readings from Last 3 Encounters:  09/08/22 224 lb 8 oz (101.8 kg)  07/05/22 224 lb (101.6 kg)  06/18/22 224 lb (101.6 kg)     HPI   Prior to Admission medications   Medication Sig Start Date End Date Taking? Authorizing Provider  aspirin 81 MG tablet Take 81 mg by mouth daily.   Yes [provider]  Cholecalciferol (VITAMIN D PO) Take 1 capsule by mouth daily. 1000 units   Yes [provider]  Cyanocobalamin (VITAMIN B12 PO) Take 1,500 mcg by mouth.   Yes [provider]  losartan-hydrochlorothiazide (HYZAAR) 50-12.5 MG tablet Take 1 tablet by mouth daily. 09/15/21  Yes Juliany Daughety, MiInes BloomerMD  Multiple Vitamins-Minerals (CENTRUM PO) Take 1 tablet by mouth daily.   Yes [provider]  rosuvastatin (CRESTOR) 10 MG tablet Take 1 tablet (10 mg total) by mouth daily. 09/16/21  Yes SaHorald PollenMD    Allergies  Allergen Reactions   Sulfa Antibiotics Rash    Patient Active Problem List   Diagnosis Date Noted   Dyslipidemia 09/16/2021   Essential hypertension 12/10/2020   Prediabetes 12/10/2020    Past Medical History:  Diagnosis Date   [place] [place]   changed [place] and it caused blood clots   Colon polyps    history of colon polyps   H/O blood clots 2012   leg   Heart murmur    Hyperlipidemia    Hypertension     Past Surgical History:  Procedure Laterality Date   COLONOSCOPY W/ POLYPECTOMY     NO PAST SURGERIES      Social History   Socioeconomic  History   Marital status: Married    Spouse name: Not on file   Number of children: Not on file   Years of education: Not on file   Highest education level: Not on file  Occupational History   Not on file  Tobacco Use   Smoking status: Never   Smokeless tobacco: Never  Vaping Use   Vaping Use: Never used  Substance and Sexual Activity   Alcohol use: No   Drug use: No   Sexual activity: Not on file  Other Topics Concern   Not on file  Social History Narrative   Not on file   Social Determinants of Health   Financial Resource Strain: Not on file  Food Insecurity: Not on file  Transportation Needs: Not on file  Physical Activity: Not on file  Stress: Not on file  Social Connections: Not on file  Intimate Partner Violence: Not on file    Family History  Problem Relation Age of Onset   Colon polyps Father    Colon cancer Father [id number] Esophageal cancer Neg Hx    Rectal cancer Neg Hx    Stomach cancer Neg Hx      Review of Systems  Constitutional: Negative.  Negative for chills and fever.  HENT: Negative.  Negative for congestion and sore throat.  Respiratory: Negative.  Negative for cough and shortness of breath.   Cardiovascular: Negative.  Negative for chest pain and palpitations.  Gastrointestinal:  Negative for abdominal pain, diarrhea, nausea and vomiting.  Genitourinary: Negative.  Negative for dysuria and hematuria.  Skin: Negative.  Negative for rash.  Neurological: Negative.  Negative for dizziness and headaches.  All other systems reviewed and are negative.  Today's Vitals   09/08/22 1547  BP: 130/76  Pulse: 63  Temp: 97.6 F (36.4 C)  TempSrc: Oral  SpO2: 97%  Weight: 224 lb 8 oz (101.8 kg)  Height: 5' 5"$  (1.651 m)   Body mass index is 37.36 kg/m.   Physical Exam Vitals reviewed.  Constitutional:      Appearance: Normal appearance.  HENT:     Head: Normocephalic.  Eyes:     Extraocular Movements: Extraocular movements intact.      Conjunctiva/sclera: Conjunctivae normal.     Pupils: Pupils are equal, round, and reactive to light.  Cardiovascular:     Rate and Rhythm: Normal rate and regular rhythm.     Pulses: Normal pulses.     Heart sounds: Normal heart sounds.  Pulmonary:     Effort: Pulmonary effort is normal.     Breath sounds: Normal breath sounds.  Musculoskeletal:     Cervical back: No tenderness.  Lymphadenopathy:     Cervical: No cervical adenopathy.  Skin:    General: Skin is warm and dry.  Neurological:     General: No focal deficit present.     Mental Status: She is alert and oriented to person, place, and time.  Psychiatric:        Mood and Affect: Mood normal.        Behavior: Behavior normal.      ASSESSMENT & PLAN: A total of 42 minutes was spent with the patient and counseling/coordination of care regarding preparing for this visit, review of most recent office visit notes, review of multiple chronic medical conditions and other treatment, review of all medications, cardiovascular risks associated with hypertension and dyslipidemia, education on nutrition, review of health maintenance items, prognosis, documentation and need for follow-up.  Problem List Items Addressed This Visit       Cardiovascular and Mediastinum   Essential hypertension - Primary    Well-controlled hypertension. Continue Hyzaar 50-12.5 mg daily Cardiovascular risks associated with hypertension discussed Dietary approaches to stop hypertension discussed. Follow-up in 6 months. Blood work done today.      Relevant Medications   losartan-hydrochlorothiazide (HYZAAR) 50-12.5 MG tablet   rosuvastatin (CRESTOR) 10 MG tablet   Other Relevant Orders   CBC with Differential/Platelet   Comprehensive metabolic panel   Hemoglobin A1c   Lipid panel     Other   Prediabetes    Diet and nutrition discussed Hemoglobin A1c done today. Advised to decrease amount of daily carbohydrate intake and daily calories and  increase amount of plant-based protein in her diet.      Relevant Orders   Hemoglobin A1c   Dyslipidemia    Chronic stable condition. Lipid profile done today. Continue rosuvastatin 10 mg daily. The 10-year ASCVD risk score (Arnett DK, et al., 2019) is: 5.6%   Values used to calculate the score:     Age: 89 years     Sex: Female     Is Non-Hispanic African American: No     Diabetic: No     Tobacco smoker: No     Systolic Blood Pressure: [id number] mmHg  Is BP treated: Yes     HDL Cholesterol: 53.7 mg/dL     Total Cholesterol: 227 mg/dL       Relevant Medications   rosuvastatin (CRESTOR) 10 MG tablet   Other Relevant Orders   CBC with Differential/Platelet   Comprehensive metabolic panel   Hemoglobin A1c   Lipid panel   Patient Instructions  Health Maintenance, Female Adopting a healthy lifestyle and getting preventive care are important in promoting health and wellness. Ask your health care provider about: The right schedule for you to have regular tests and exams. Things you can do on your own to prevent diseases and keep yourself healthy. What should I know about diet, weight, and exercise? Eat a healthy diet  Eat a diet that includes plenty of vegetables, fruits, low-fat dairy products, and lean protein. Do not eat a lot of foods that are high in solid fats, added sugars, or sodium. Maintain a healthy weight Body mass index (BMI) is used to identify weight problems. It estimates body fat based on height and weight. Your health care provider can help determine your BMI and help you achieve or maintain a healthy weight. Get regular exercise Get regular exercise. This is one of the most important things you can do for your health. Most adults should: Exercise for at least 150 minutes each week. The exercise should increase your heart rate and make you sweat (moderate-intensity exercise). Do strengthening exercises at least twice a week. This is in addition to the  moderate-intensity exercise. Spend less time sitting. Even light physical activity can be beneficial. Watch cholesterol and blood lipids Have your blood tested for lipids and cholesterol at 62 years of age, then have this test every 5 years. Have your cholesterol levels checked more often if: Your lipid or cholesterol levels are high. You are older than 62 years of age. You are at high risk for heart disease. What should I know about cancer screening? Depending on your health history and family history, you may need to have cancer screening at various ages. This may include screening for: Breast cancer. Cervical cancer. Colorectal cancer. Skin cancer. Lung cancer. What should I know about heart disease, diabetes, and high blood pressure? Blood pressure and heart disease High blood pressure causes heart disease and increases the risk of stroke. This is more likely to develop in people who have high blood pressure readings or are overweight. Have your blood pressure checked: Every 3-5 years if you are 50-46 years of age. Every year if you are 73 years old or older. Diabetes Have regular diabetes screenings. This checks your fasting blood sugar level. Have the screening done: Once every three years after age 52 if you are at a normal weight and have a low risk for diabetes. More often and at a younger age if you are overweight or have a high risk for diabetes. What should I know about preventing infection? Hepatitis B If you have a higher risk for hepatitis B, you should be screened for this virus. Talk with your health care provider to find out if you are at risk for hepatitis B infection. Hepatitis C Testing is recommended for: Everyone born from [birth year] through 1965. Anyone with known risk factors for hepatitis C. Sexually transmitted infections (STIs) Get screened for STIs, including gonorrhea and chlamydia, if: You are sexually active and are younger than 62 years of age. You are  older than 62 years of age and your health care provider tells you that you are  at risk for this type of infection. Your sexual activity has changed since you were last screened, and you are at increased risk for chlamydia or gonorrhea. Ask your health care provider if you are at risk. Ask your health care provider about whether you are at high risk for HIV. Your health care provider may recommend a prescription medicine to help prevent HIV infection. If you choose to take medicine to prevent HIV, you should first get tested for HIV. You should then be tested every 3 months for as long as you are taking the medicine. Pregnancy If you are about to stop having your period (premenopausal) and you may become pregnant, seek counseling before you get pregnant. Take 400 to 800 micrograms (mcg) of folic acid every day if you become pregnant. Ask for birth control (contraception) if you want to prevent pregnancy. Osteoporosis and menopause Osteoporosis is a disease in which the bones lose minerals and strength with aging. This can result in bone fractures. If you are 96 years old or older, or if you are at risk for osteoporosis and fractures, ask your health care provider if you should: Be screened for bone loss. Take a calcium or vitamin D supplement to lower your risk of fractures. Be given hormone replacement therapy (HRT) to treat symptoms of menopause. Follow these instructions at home: Alcohol use Do not drink alcohol if: Your health care provider tells you not to drink. You are pregnant, may be pregnant, or are planning to become pregnant. If you drink alcohol: Limit how much you have to: 0-1 drink a day. Know how much alcohol is in your drink. In the [place] one drink equals one 12 oz bottle of beer (355 mL), one 5 oz glass of wine (148 mL), or one 1 oz glass of hard liquor (44 mL). Lifestyle Do not use any products that contain nicotine or tobacco. These products include cigarettes, chewing  tobacco, and vaping devices, such as e-cigarettes. If you need help quitting, ask your health care provider. Do not use street drugs. Do not share needles. Ask your health care provider for help if you need support or information about quitting drugs. General instructions Schedule regular health, dental, and eye exams. Stay current with your vaccines. Tell your health care provider if: You often feel depressed. You have ever been abused or do not feel safe at home. Summary Adopting a healthy lifestyle and getting preventive care are important in promoting health and wellness. Follow your health care provider's instructions about healthy diet, exercising, and getting tested or screened for diseases. Follow your health care provider's instructions on monitoring your cholesterol and blood pressure. This information is not intended to replace advice given to you by your health care provider. Make sure you discuss any questions you have with your health care provider. Document Revised: 11/24/2020 Document Reviewed: 11/24/2020 Elsevier Patient Education  [place], MD [facility]

## 2022-09-08 NOTE — Assessment & Plan Note (Signed)
Well-controlled hypertension. Continue Hyzaar 50-12.5 mg daily Cardiovascular risks associated with hypertension discussed Dietary approaches to stop hypertension discussed. Follow-up in 6 months. Blood work done today.

## 2022-09-08 NOTE — Patient Instructions (Signed)

## 2022-09-08 NOTE — Assessment & Plan Note (Signed)
Chronic stable condition. Lipid profile done today. Continue rosuvastatin 10 mg daily. The 10-year ASCVD risk score (Arnett DK, et al., 2019) is: 5.6%   Values used to calculate the score:     Age: 62 years     Sex: Female     Is Non-Hispanic African American: No     Diabetic: No     Tobacco smoker: No     Systolic Blood Pressure: [id number] mmHg     Is BP treated: Yes     HDL Cholesterol: 53.7 mg/dL     Total Cholesterol: 227 mg/dL

## 2022-09-09 LAB — LIPID PANEL
Cholesterol: 161 mg/dL (ref 0–200)
HDL: 64.3 mg/dL (ref >39.00)
LDL Cholesterol: 68 mg/dL (ref 0–99)
NonHDL: 96.99
Total CHOL/HDL Ratio: 3
Triglycerides: 147 mg/dL (ref 0.0–149.0)
VLDL: 29.4 mg/dL (ref 0.0–40.0)

## 2022-09-09 LAB — COMPREHENSIVE METABOLIC PANEL
ALT: 18 U/L (ref 0–35)
AST: 15 U/L (ref 0–37)
Albumin: 4.7 g/dL (ref 3.5–5.2)
Alkaline Phosphatase: 39 U/L (ref 39–117)
BUN: 10 mg/dL (ref 6–23)
CO2: 27 mEq/L (ref 19–32)
Calcium: 10 mg/dL (ref 8.4–10.5)
Chloride: 102 mEq/L (ref 96–112)
Creatinine, Ser: 0.75 mg/dL (ref 0.40–1.20)
GFR: 85.9 mL/min (ref >60.00)
Glucose, Bld: 97 mg/dL (ref 70–99)
Potassium: 4.2 mEq/L (ref 3.5–5.1)
Sodium: 141 mEq/L (ref 135–145)
Total Bilirubin: 0.5 mg/dL (ref 0.2–1.2)
Total Protein: 7.2 g/dL (ref 6.0–8.3)

## 2023-02-22 DIAGNOSIS — Z6837 Body mass index (BMI) 37.0-37.9, adult: Secondary | ICD-10-CM | POA: Diagnosis not present

## 2023-02-22 DIAGNOSIS — Z13820 Encounter for screening for osteoporosis: Secondary | ICD-10-CM | POA: Diagnosis not present

## 2023-02-22 DIAGNOSIS — Z1231 Encounter for screening mammogram for malignant neoplasm of breast: Secondary | ICD-10-CM | POA: Diagnosis not present

## 2023-02-22 DIAGNOSIS — Z01419 Encounter for gynecological examination (general) (routine) without abnormal findings: Secondary | ICD-10-CM | POA: Diagnosis not present

## 2023-02-22 DIAGNOSIS — Z124 Encounter for screening for malignant neoplasm of cervix: Secondary | ICD-10-CM | POA: Diagnosis not present

## 2023-02-28 ENCOUNTER — Other Ambulatory Visit: Payer: Self-pay | Admitting: Obstetrics and Gynecology

## 2023-02-28 DIAGNOSIS — R928 Other abnormal and inconclusive findings on diagnostic imaging of breast: Secondary | ICD-10-CM

## 2023-03-09 ENCOUNTER — Other Ambulatory Visit: Payer: Self-pay | Admitting: Emergency Medicine

## 2023-03-09 ENCOUNTER — Telehealth: Payer: Self-pay | Admitting: *Deleted

## 2023-03-09 ENCOUNTER — Encounter: Payer: Self-pay | Admitting: Emergency Medicine

## 2023-03-09 ENCOUNTER — Ambulatory Visit: Payer: BC Managed Care – PPO | Admitting: Emergency Medicine

## 2023-03-09 VITALS — BP 124/72 | HR 72 | Temp 98.1°F | Ht 65.0 in | Wt 226.4 lb

## 2023-03-09 DIAGNOSIS — E785 Hyperlipidemia, unspecified: Secondary | ICD-10-CM | POA: Diagnosis not present

## 2023-03-09 DIAGNOSIS — Z6837 Body mass index (BMI) 37.0-37.9, adult: Secondary | ICD-10-CM

## 2023-03-09 DIAGNOSIS — R7303 Prediabetes: Secondary | ICD-10-CM

## 2023-03-09 DIAGNOSIS — I10 Essential (primary) hypertension: Secondary | ICD-10-CM | POA: Diagnosis not present

## 2023-03-09 MED ORDER — WEGOVY 0.25 MG/0.5ML ~~LOC~~ SOAJ
0.2500 mg | SUBCUTANEOUS | 3 refills | Status: DC
Start: 2023-03-09 — End: 2023-03-18

## 2023-03-09 NOTE — Assessment & Plan Note (Signed)
Diet and nutrition discussed Cardiovascular risks associated with obesity discussed Advised to decrease amount of daily carbohydrate intake and daily calories and increase amount of plant-based protein in her diet. Recommend to start weekly Wegovy.  Will titrate dose up as tolerated every 2 weeks.

## 2023-03-09 NOTE — Patient Instructions (Signed)
Calorie Counting for Weight Loss Calories are units of energy. Your body needs a certain number of calories from food to keep going throughout the day. When you eat or drink more calories than your body needs, your body stores the extra calories mostly as fat. When you eat or drink fewer calories than your body needs, your body burns fat to get the energy it needs. Calorie counting means keeping track of how many calories you eat and drink each day. Calorie counting can be helpful if you need to lose weight. If you eat fewer calories than your body needs, you should lose weight. Ask your health care provider what a healthy weight is for you. For calorie counting to work, you will need to eat the right number of calories each day to lose a healthy amount of weight per week. A dietitian can help you figure out how many calories you need in a day and will suggest ways to reach your calorie goal. A healthy amount of weight to lose each week is usually 1-2 lb (0.5-0.9 kg). This usually means that your daily calorie intake should be reduced by 500-750 calories. Eating 1,200-1,500 calories a day can help most women lose weight. Eating 1,500-1,800 calories a day can help most men lose weight. What do I need to know about calorie counting? Work with your health care provider or dietitian to determine how many calories you should get each day. To meet your daily calorie goal, you will need to: Find out how many calories are in each food that you would like to eat. Try to do this before you eat. Decide how much of the food you plan to eat. Keep a food log. Do this by writing down what you ate and how many calories it had. To successfully lose weight, it is important to balance calorie counting with a healthy lifestyle that includes regular activity. Where do I find calorie information?  The number of calories in a food can be found on a Nutrition Facts label. If a food does not have a Nutrition Facts label, try  to look up the calories online or ask your dietitian for help. Remember that calories are listed per serving. If you choose to have more than one serving of a food, you will have to multiply the calories per serving by the number of servings you plan to eat. For example, the label on a package of bread might say that a serving size is 1 slice and that there are 90 calories in a serving. If you eat 1 slice, you will have eaten 90 calories. If you eat 2 slices, you will have eaten 180 calories. How do I keep a food log? After each time that you eat, record the following in your food log as soon as possible: What you ate. Be sure to include toppings, sauces, and other extras on the food. How much you ate. This can be measured in cups, ounces, or number of items. How many calories were in each food and drink. The total number of calories in the food you ate. Keep your food log near you, such as in a pocket-sized notebook or on an app or website on your mobile phone. Some programs will calculate calories for you and show you how many calories you have left to meet your daily goal. What are some portion-control tips? Know how many calories are in a serving. This will help you know how many servings you can have of a certain   food. Use a measuring cup to measure serving sizes. You could also try weighing out portions on a kitchen scale. With time, you will be able to estimate serving sizes for some foods. Take time to put servings of different foods on your favorite plates or in your favorite bowls and cups so you know what a serving looks like. Try not to eat straight from a food's packaging, such as from a bag or box. Eating straight from the package makes it hard to see how much you are eating and can lead to overeating. Put the amount you would like to eat in a cup or on a plate to make sure you are eating the right portion. Use smaller plates, glasses, and bowls for smaller portions and to prevent  overeating. Try not to multitask. For example, avoid watching TV or using your computer while eating. If it is time to eat, sit down at a table and enjoy your food. This will help you recognize when you are full. It will also help you be more mindful of what and how much you are eating. What are tips for following this plan? Reading food labels Check the calorie count compared with the serving size. The serving size may be smaller than what you are used to eating. Check the source of the calories. Try to choose foods that are high in protein, fiber, and vitamins, and low in saturated fat, trans fat, and sodium. Shopping Read nutrition labels while you shop. This will help you make healthy decisions about which foods to buy. Pay attention to nutrition labels for low-fat or fat-free foods. These foods sometimes have the same number of calories or more calories than the full-fat versions. They also often have added sugar, starch, or salt to make up for flavor that was removed with the fat. Make a grocery list of lower-calorie foods and stick to it. Cooking Try to cook your favorite foods in a healthier way. For example, try baking instead of frying. Use low-fat dairy products. Meal planning Use more fruits and vegetables. One-half of your plate should be fruits and vegetables. Include lean proteins, such as chicken, turkey, and fish. Lifestyle Each week, aim to do one of the following: 150 minutes of moderate exercise, such as walking. 75 minutes of vigorous exercise, such as running. General information Know how many calories are in the foods you eat most often. This will help you calculate calorie counts faster. Find a way of tracking calories that works for you. Get creative. Try different apps or programs if writing down calories does not work for you. What foods should I eat?  Eat nutritious foods. It is better to have a nutritious, high-calorie food, such as an avocado, than a food with  few nutrients, such as a bag of potato chips. Use your calories on foods and drinks that will fill you up and will not leave you hungry soon after eating. Examples of foods that fill you up are nuts and nut butters, vegetables, lean proteins, and high-fiber foods such as whole grains. High-fiber foods are foods with more than 5 g of fiber per serving. Pay attention to calories in drinks. Low-calorie drinks include water and unsweetened drinks. The items listed above may not be a complete list of foods and beverages you can eat. Contact a dietitian for more information. What foods should I limit? Limit foods or drinks that are not good sources of vitamins, minerals, or protein or that are high in unhealthy fats. These   include: Candy. Other sweets. Sodas, specialty coffee drinks, alcohol, and juice. The items listed above may not be a complete list of foods and beverages you should avoid. Contact a dietitian for more information. How do I count calories when eating out? Pay attention to portions. Often, portions are much larger when eating out. Try these tips to keep portions smaller: Consider sharing a meal instead of getting your own. If you get your own meal, eat only half of it. Before you start eating, ask for a container and put half of your meal into it. When available, consider ordering smaller portions from the menu instead of full portions. Pay attention to your food and drink choices. Knowing the way food is cooked and what is included with the meal can help you eat fewer calories. If calories are listed on the menu, choose the lower-calorie options. Choose dishes that include vegetables, fruits, whole grains, low-fat dairy products, and lean proteins. Choose items that are boiled, broiled, grilled, or steamed. Avoid items that are buttered, battered, fried, or served with cream sauce. Items labeled as crispy are usually fried, unless stated otherwise. Choose water, low-fat milk,  unsweetened iced tea, or other drinks without added sugar. If you want an alcoholic beverage, choose a lower-calorie option, such as a glass of wine or light beer. Ask for dressings, sauces, and syrups on the side. These are usually high in calories, so you should limit the amount you eat. If you want a salad, choose a garden salad and ask for grilled meats. Avoid extra toppings such as bacon, cheese, or fried items. Ask for the dressing on the side, or ask for olive oil and vinegar or lemon to use as dressing. Estimate how many servings of a food you are given. Knowing serving sizes will help you be aware of how much food you are eating at restaurants. Where to find more information Centers for Disease Control and Prevention: www.cdc.gov [place] Department [employer] Agriculture: myplate.gov Summary Calorie counting means keeping track of how many calories you eat and drink each day. If you eat fewer calories than your body needs, you should lose weight. A healthy amount of weight to lose per week is usually 1-2 lb (0.5-0.9 kg). This usually means reducing your daily calorie intake by 500-750 calories. The number of calories in a food can be found on a Nutrition Facts label. If a food does not have a Nutrition Facts label, try to look up the calories online or ask your dietitian for help. Use smaller plates, glasses, and bowls for smaller portions and to prevent overeating. Use your calories on foods and drinks that will fill you up and not leave you hungry shortly after a meal. This information is not intended to replace advice given to you by your health care provider. Make sure you discuss any questions you have with your health care provider. Document Revised: 08/16/2019 Document Reviewed: 08/16/2019 [employer] Patient Education  2023 [employer]  

## 2023-03-09 NOTE — Assessment & Plan Note (Signed)
BP Readings from Last 3 Encounters:  03/09/23 124/72  09/08/22 130/76  07/05/22 130/74  Well-controlled hypertension. Continue Hyzaar 50-12.5 mg daily Cardiovascular risks associated with hypertension discussed Diet diary approaches to stop hypertension discussed

## 2023-03-09 NOTE — Progress Notes (Signed)
Renee Swanson 62 y.o.   Chief Complaint  Patient presents with   Medical Management of Chronic Issues    f/u appt, no concerns, weight loss discussion     HISTORY OF PRESENT ILLNESS: This is a 62 y.o. female here for 40-month follow-up of chronic medical conditions Also concerned about her weight.  Wants to discuss weight loss options. No other complaints or medical concerns today.  HPI   Prior to Admission medications   Medication Sig Start Date End Date Taking? Authorizing Provider  aspirin 81 MG tablet Take 81 mg by mouth daily.   Yes [provider]  Cholecalciferol (VITAMIN D PO) Take 1 capsule by mouth daily. 1000 units   Yes [provider]  Cyanocobalamin (VITAMIN B12 PO) Take 1,500 mcg by mouth.   Yes [provider]  hydrochlorothiazide (HYDRODIURIL) 25 MG tablet Take 1 tablet by mouth daily. 01/14/23  Yes [provider]  losartan-hydrochlorothiazide (HYZAAR) 50-12.5 MG tablet Take 1 tablet by mouth daily. 09/08/22  Yes Margret Moat, Eilleen Kempf, MD  Multiple Vitamins-Minerals (CENTRUM PO) Take 1 tablet by mouth daily.   Yes [provider]  rosuvastatin (CRESTOR) 10 MG tablet Take 1 tablet (10 mg total) by mouth daily. 09/08/22  Yes Teona Vargus, Eilleen Kempf, MD  Semaglutide-Weight Management [facility]) 0.25 MG/0.5ML SOAJ Inject 0.25 mg into the skin once a week. 03/09/23  Yes Georgina Quint, MD    Allergies  Allergen Reactions   Sulfa Antibiotics Rash    Patient Active Problem List   Diagnosis Date Noted   Class 2 severe obesity due to excess calories with serious comorbidity and body mass index (BMI) of 37.0 to 37.9 in adult [facility]) 03/09/2023   Dyslipidemia 09/16/2021   Essential hypertension 12/10/2020   Prediabetes 12/10/2020    Past Medical History:  Diagnosis Date   Clotting disorder (HCC)    changed BCP and it caused blood clots   Colon polyps    history of colon polyps   H/O blood clots 2012   leg    Heart murmur    Hyperlipidemia    Hypertension     Past Surgical History:  Procedure Laterality Date   COLONOSCOPY W/ POLYPECTOMY     NO PAST SURGERIES      Social History   Socioeconomic History   Marital status: Married    Spouse name: Not on file   Number of children: Not on file   Years of education: Not on file   Highest education level: Not on file  Occupational History   Not on file  Tobacco Use   Smoking status: Never   Smokeless tobacco: Never  Vaping Use   Vaping status: Never Used  Substance and Sexual Activity   Alcohol use: No   Drug use: No   Sexual activity: Not on file  Other Topics Concern   Not on file  Social History Narrative   Not on file   Social Determinants of Health   Financial Resource Strain: Not on file  Food Insecurity: Not on file  Transportation Needs: Not on file  Physical Activity: Not on file  Stress: Not on file  Social Connections: Not on file  Intimate Partner Violence: Not on file    Family History  Problem Relation Age of Onset   Colon polyps Father    Colon cancer Father 37   Esophageal cancer Neg Hx    Rectal cancer Neg Hx    Stomach cancer Neg Hx  Review of Systems  Constitutional: Negative.  Negative for chills and fever.  HENT: Negative.  Negative for congestion and sore throat.   Respiratory: Negative.  Negative for cough and shortness of breath.   Cardiovascular: Negative.  Negative for chest pain and palpitations.  Gastrointestinal:  Negative for abdominal pain, diarrhea, nausea and vomiting.  Genitourinary: Negative.  Negative for dysuria and hematuria.  Skin: Negative.  Negative for rash.  Neurological: Negative.  Negative for dizziness and headaches.  All other systems reviewed and are negative.   Vitals:   03/09/23 1601  BP: 124/72  Pulse: 72  Temp: 98.1 F (36.7 C)  SpO2: 95%    Physical Exam Vitals reviewed.  Constitutional:      Appearance: Normal appearance.  HENT:     Head:  Normocephalic.  Eyes:     Extraocular Movements: Extraocular movements intact.  Cardiovascular:     Rate and Rhythm: Normal rate.  Pulmonary:     Effort: Pulmonary effort is normal.  Skin:    General: Skin is warm and dry.  Neurological:     Mental Status: She is alert and oriented to person, place, and time.  Psychiatric:        Mood and Affect: Mood normal.        Behavior: Behavior normal.      ASSESSMENT & PLAN: A total of 42 minutes was spent with the patient and counseling/coordination of care regarding preparing for this visit, review of most recent office visit notes, review of multiple chronic medical conditions under management, review of all medications, cardiovascular risks associated with hypertension and dyslipidemia, education on nutrition, weight loss options and management, prognosis, documentation, and need for follow-up.  Problem List Items Addressed This Visit       Cardiovascular and Mediastinum   Essential hypertension - Primary    BP Readings from Last 3 Encounters:  03/09/23 124/72  09/08/22 130/76  07/05/22 130/74  Well-controlled hypertension. Continue Hyzaar 50-12.5 mg daily Cardiovascular risks associated with hypertension discussed Diet diary approaches to stop hypertension discussed         Other   Prediabetes    Diet and nutrition discussed      Dyslipidemia    Chronic stable condition Diet and nutrition discussed Continue rosuvastatin 10 mg daily The 10-year ASCVD risk score (Arnett DK, et al., 2019) is: 3.6%   Values used to calculate the score:     Age: 31 years     Sex: Female     Is Non-Hispanic African American: No     Diabetic: No     Tobacco smoker: No     Systolic Blood Pressure: 124 mmHg     Is BP treated: Yes     HDL Cholesterol: 64.3 mg/dL     Total Cholesterol: 161 mg/dL       Class 2 severe obesity due to excess calories with serious comorbidity and body mass index (BMI) of 37.0 to 37.9 in adult [facility])    Diet  and nutrition discussed Cardiovascular risks associated with obesity discussed Advised to decrease amount of daily carbohydrate intake and daily calories and increase amount of plant-based protein in her diet. Recommend to start weekly Wegovy.  Will titrate dose up as tolerated every 2 weeks.      Relevant Medications   Semaglutide-Weight Management (WEGOVY) 0.25 MG/0.5ML SOAJ   Patient Instructions  Calorie Counting for Weight Loss Calories are units of energy. Your body needs a certain number of calories from food to keep going  throughout the day. When you eat or drink more calories than your body needs, your body stores the extra calories mostly as fat. When you eat or drink fewer calories than your body needs, your body burns fat to get the energy it needs. Calorie counting means keeping track of how many calories you eat and drink each day. Calorie counting can be helpful if you need to lose weight. If you eat fewer calories than your body needs, you should lose weight. Ask your health care provider what a healthy weight is for you. For calorie counting to work, you will need to eat the right number of calories each day to lose a healthy amount of weight per week. A dietitian can help you figure out how many calories you need in a day and will suggest ways to reach your calorie goal. A healthy amount of weight to lose each week is usually 1-2 lb (0.5-0.9 kg). This usually means that your daily calorie intake should be reduced by 500-750 calories. Eating 1,200-1,500 calories a day can help most women lose weight. Eating 1,500-1,800 calories a day can help most men lose weight. What do I need to know about calorie counting? Work with your health care provider or dietitian to determine how many calories you should get each day. To meet your daily calorie goal, you will need to: Find out how many calories are in each food that you would like to eat. Try to do this before you eat. Decide how much  of the food you plan to eat. Keep a food log. Do this by writing down what you ate and how many calories it had. To successfully lose weight, it is important to balance calorie counting with a healthy lifestyle that includes regular activity. Where do I find calorie information?  The number of calories in a food can be found on a Nutrition Facts label. If a food does not have a Nutrition Facts label, try to look up the calories online or ask your dietitian for help. Remember that calories are listed per serving. If you choose to have more than one serving of a food, you will have to multiply the calories per serving by the number of servings you plan to eat. For example, the label on a package of bread might say that a serving size is 1 slice and that there are 90 calories in a serving. If you eat 1 slice, you will have eaten 90 calories. If you eat 2 slices, you will have eaten 180 calories. How do I keep a food log? After each time that you eat, record the following in your food log as soon as possible: What you ate. Be sure to include toppings, sauces, and other extras on the food. How much you ate. This can be measured in cups, ounces, or number of items. How many calories were in each food and drink. The total number of calories in the food you ate. Keep your food log near you, such as in a pocket-sized notebook or on an app or website on your mobile phone. Some programs will calculate calories for you and show you how many calories you have left to meet your daily goal. What are some portion-control tips? Know how many calories are in a serving. This will help you know how many servings you can have of a certain food. Use a measuring cup to measure serving sizes. You could also try weighing out portions on a kitchen scale. With time, you  will be able to estimate serving sizes for some foods. Take time to put servings of different foods on your favorite plates or in your favorite bowls and cups  so you know what a serving looks like. Try not to eat straight from a food's packaging, such as from a bag or box. Eating straight from the package makes it hard to see how much you are eating and can lead to overeating. Put the amount you would like to eat in a cup or on a plate to make sure you are eating the right portion. Use smaller plates, glasses, and bowls for smaller portions and to prevent overeating. Try not to multitask. For example, avoid watching TV or using your computer while eating. If it is time to eat, sit down at a table and enjoy your food. This will help you recognize when you are full. It will also help you be more mindful of what and how much you are eating. What are tips for following this plan? Reading food labels Check the calorie count compared with the serving size. The serving size may be smaller than what you are used to eating. Check the source of the calories. Try to choose foods that are high in protein, fiber, and vitamins, and low in saturated fat, trans fat, and sodium. Shopping Read nutrition labels while you shop. This will help you make healthy decisions about which foods to buy. Pay attention to nutrition labels for low-fat or fat-free foods. These foods sometimes have the same number of calories or more calories than the full-fat versions. They also often have added sugar, starch, or salt to make up for flavor that was removed with the fat. Make a grocery list of lower-calorie foods and stick to it. Cooking Try to cook your favorite foods in a healthier way. For example, try baking instead of frying. Use low-fat dairy products. Meal planning Use more fruits and vegetables. One-half of your plate should be fruits and vegetables. Include lean proteins, such as chicken, [place], and fish. Lifestyle Each week, aim to do one of the following: 150 minutes of moderate exercise, such as walking. 75 minutes of vigorous exercise, such as running. General  information Know how many calories are in the foods you eat most often. This will help you calculate calorie counts faster. Find a way of tracking calories that works for you. Get creative. Try different apps or programs if writing down calories does not work for you. What foods should I eat?  Eat nutritious foods. It is better to have a nutritious, high-calorie food, such as an avocado, than a food with few nutrients, such as a bag of potato chips. Use your calories on foods and drinks that will fill you up and will not leave you hungry soon after eating. Examples of foods that fill you up are nuts and nut butters, vegetables, lean proteins, and high-fiber foods such as whole grains. High-fiber foods are foods with more than 5 g of fiber per serving. Pay attention to calories in drinks. Low-calorie drinks include water and unsweetened drinks. The items listed above may not be a complete list of foods and beverages you can eat. Contact a dietitian for more information. What foods should I limit? Limit foods or drinks that are not good sources of vitamins, minerals, or protein or that are high in unhealthy fats. These include: Candy. Other sweets. Sodas, specialty coffee drinks, alcohol, and juice. The items listed above may not be a complete list of foods  and beverages you should avoid. Contact a dietitian for more information. How do I count calories when eating out? Pay attention to portions. Often, portions are much larger when eating out. Try these tips to keep portions smaller: Consider sharing a meal instead of getting your own. If you get your own meal, eat only half of it. Before you start eating, ask for a container and put half of your meal into it. When available, consider ordering smaller portions from the menu instead of full portions. Pay attention to your food and drink choices. Knowing the way food is cooked and what is included with the meal can help you eat fewer calories. If  calories are listed on the menu, choose the lower-calorie options. Choose dishes that include vegetables, fruits, whole grains, low-fat dairy products, and lean proteins. Choose items that are boiled, broiled, grilled, or steamed. Avoid items that are buttered, battered, fried, or served with cream sauce. Items labeled as crispy are usually fried, unless stated otherwise. Choose water, low-fat milk, unsweetened iced tea, or other drinks without added sugar. If you want an alcoholic beverage, choose a lower-calorie option, such as a glass of wine or light beer. Ask for dressings, sauces, and syrups on the side. These are usually high in calories, so you should limit the amount you eat. If you want a salad, choose a garden salad and ask for grilled meats. Avoid extra toppings such as bacon, cheese, or fried items. Ask for the dressing on the side, or ask for olive oil and vinegar or lemon to use as dressing. Estimate how many servings of a food you are given. Knowing serving sizes will help you be aware of how much food you are eating at restaurants. Where to find more information Centers for Disease Control and Prevention: FootballExhibition.com.br U.S. Department of Agriculture: WrestlingReporter.dk Summary Calorie counting means keeping track of how many calories you eat and drink each day. If you eat fewer calories than your body needs, you should lose weight. A healthy amount of weight to lose per week is usually 1-2 lb (0.5-0.9 kg). This usually means reducing your daily calorie intake by 500-750 calories. The number of calories in a food can be found on a Nutrition Facts label. If a food does not have a Nutrition Facts label, try to look up the calories online or ask your dietitian for help. Use smaller plates, glasses, and bowls for smaller portions and to prevent overeating. Use your calories on foods and drinks that will fill you up and not leave you hungry shortly after a meal. This information is not intended to  replace advice given to you by your health care provider. Make sure you discuss any questions you have with your health care provider. Document Revised: 08/16/2019 Document Reviewed: 08/16/2019 Elsevier Patient Education  2023 Elsevier Inc.      Edwina Barth, MD [facility]

## 2023-03-09 NOTE — Assessment & Plan Note (Signed)
Chronic stable condition Diet and nutrition discussed Continue rosuvastatin 10 mg daily The 10-year ASCVD risk score (Arnett DK, et al., 2019) is: 3.6%   Values used to calculate the score:     Age: 62 years     Sex: Female     Is Non-Hispanic African American: No     Diabetic: No     Tobacco smoker: No     Systolic Blood Pressure: 124 mmHg     Is BP treated: Yes     HDL Cholesterol: 64.3 mg/dL     Total Cholesterol: 161 mg/dL

## 2023-03-09 NOTE — Telephone Encounter (Signed)
Patient needs a PA started for the [facility]

## 2023-03-09 NOTE — Assessment & Plan Note (Signed)
Diet and nutrition discussed. 

## 2023-03-10 ENCOUNTER — Other Ambulatory Visit (HOSPITAL_COMMUNITY): Payer: Self-pay

## 2023-03-10 ENCOUNTER — Telehealth: Payer: Self-pay

## 2023-03-10 NOTE — Telephone Encounter (Signed)
Pharmacy Patient Advocate Encounter   Received notification from Pt Calls Messages that prior authorization for Wegovy 0.25mg /0.39ml is required/requested.   Insurance verification completed.   The patient is insured through [facility] .   Per test claim: PA required; PA started via CoverMyMeds. KEY [id number] . Waiting for clinical questions to populate.

## 2023-03-10 NOTE — Telephone Encounter (Signed)
Call patient please.  Recommend change of pharmacy.  Recommend [facility] outpatient [facility]

## 2023-03-11 ENCOUNTER — Ambulatory Visit: Payer: BC Managed Care – PPO

## 2023-03-11 ENCOUNTER — Ambulatory Visit
Admission: RE | Admit: 2023-03-11 | Discharge: 2023-03-11 | Disposition: A | Discharge status: Home / Self Care | Payer: BC Managed Care – PPO | Source: Ambulatory Visit | Attending: Obstetrics and Gynecology | Admitting: Obstetrics and Gynecology

## 2023-03-11 DIAGNOSIS — R928 Other abnormal and inconclusive findings on diagnostic imaging of breast: Secondary | ICD-10-CM

## 2023-03-11 NOTE — Telephone Encounter (Signed)
Called patient and left message for patient to call office to see if she wants to change pharmacy locations.

## 2023-03-14 NOTE — Telephone Encounter (Signed)
 Clinical questions answered and PA submitted

## 2023-03-15 NOTE — Telephone Encounter (Signed)
Pharmacy Patient Advocate Encounter  Received notification from [facility] that Prior Authorization for Wegovy 0.25mg /0.24ml has been DENIED. Please advise how you'd like to proceed. Full denial letter will be uploaded to the media tab. See denial reason below.   PA #/Case ID/Reference #: [id number]

## 2023-03-15 NOTE — Telephone Encounter (Signed)
Called patient and left message for patient to call office about medication PA denial

## 2023-03-17 NOTE — Telephone Encounter (Signed)
Returned your call - said she has information yall had discussed

## 2023-03-17 NOTE — Telephone Encounter (Signed)
Please ask for Katerra because this is her work phone.

## 2023-03-17 NOTE — Telephone Encounter (Signed)
Patient would like for you to call her back concerning the wygovey.  Please call her at [phone]

## 2023-03-18 ENCOUNTER — Other Ambulatory Visit: Payer: Self-pay | Admitting: *Deleted

## 2023-03-18 MED ORDER — WEGOVY 0.25 MG/0.5ML ~~LOC~~ SOAJ
0.2500 mg | SUBCUTANEOUS | 3 refills | Status: DC
Start: 2023-03-18 — End: 2023-06-13

## 2023-03-18 NOTE — Telephone Encounter (Signed)
Called patient and left message for patient to call office back. 

## 2023-03-22 ENCOUNTER — Telehealth: Payer: Self-pay | Admitting: Emergency Medicine

## 2023-03-22 NOTE — Telephone Encounter (Signed)
[facility] pharmacy called and said they received the prescription for [facility]. They said they only carry the generic form of semaglutide. They wanted to make sure that is okay with the provider. Best callback is [phone].

## 2023-03-23 NOTE — Telephone Encounter (Signed)
Called the pharmacy and gave verbal for ok to change to generic

## 2023-03-23 NOTE — Telephone Encounter (Signed)
It is okay.  Thanks.

## 2023-06-13 ENCOUNTER — Encounter: Payer: Self-pay | Admitting: Emergency Medicine

## 2023-06-13 ENCOUNTER — Ambulatory Visit: Payer: BC Managed Care – PPO | Admitting: Emergency Medicine

## 2023-06-13 VITALS — BP 124/72 | HR 65 | Temp 97.8°F | Ht 65.0 in | Wt 222.0 lb

## 2023-06-13 DIAGNOSIS — I10 Essential (primary) hypertension: Secondary | ICD-10-CM | POA: Diagnosis not present

## 2023-06-13 DIAGNOSIS — R7303 Prediabetes: Secondary | ICD-10-CM

## 2023-06-13 DIAGNOSIS — E66812 Obesity, class 2: Secondary | ICD-10-CM

## 2023-06-13 DIAGNOSIS — E785 Hyperlipidemia, unspecified: Secondary | ICD-10-CM

## 2023-06-13 DIAGNOSIS — Z6837 Body mass index (BMI) 37.0-37.9, adult: Secondary | ICD-10-CM

## 2023-06-13 MED ORDER — ZEPBOUND 7.5 MG/0.5ML ~~LOC~~ SOAJ: 7.5000 mg | SUBCUTANEOUS | 5 refills | Status: DC

## 2023-06-13 NOTE — Assessment & Plan Note (Signed)
Chronic stable condition Diet and nutrition discussed Continue rosuvastatin 10 mg daily

## 2023-06-13 NOTE — Assessment & Plan Note (Signed)
Started generic form of Wegovy Tolerating side effects well Diet and nutrition discussed Reginal Lutes however not covered by insurance Recommend start Zepbound 7.5 mg weekly Wt Readings from Last 3 Encounters:  06/13/23 222 lb (100.7 kg)  03/09/23 226 lb 6 oz (102.7 kg)  09/08/22 224 lb 8 oz (101.8 kg)

## 2023-06-13 NOTE — Progress Notes (Signed)
Renee Swanson 62 y.o.   Chief Complaint  Patient presents with   Follow-up    3 month f/u for HTN / prediabetic. Patients her  [facility] prescription sent to  [facility] in [place]     HISTORY OF PRESENT ILLNESS: This is a 62 y.o. female here for 13-month follow-up of hypertension and prediabetes Wt Readings from Last 3 Encounters:  03/09/23 226 lb 6 oz (102.7 kg)  09/08/22 224 lb 8 oz (101.8 kg)  07/05/22 224 lb (101.6 kg)     HPI   Prior to Admission medications   Medication Sig Start Date End Date Taking? Authorizing Provider  aspirin 81 MG tablet Take 81 mg by mouth daily.    [provider]  Cholecalciferol (VITAMIN D PO) Take 1 capsule by mouth daily. 1000 units    [provider]  Cyanocobalamin (VITAMIN B12 PO) Take 1,500 mcg by mouth.    [provider]  losartan-hydrochlorothiazide (HYZAAR) 50-12.5 MG tablet Take 1 tablet by mouth daily. 09/08/22   Georgina Quint, MD  Multiple Vitamins-Minerals (CENTRUM PO) Take 1 tablet by mouth daily.    [provider]  rosuvastatin (CRESTOR) 10 MG tablet Take 1 tablet (10 mg total) by mouth daily. 09/08/22   Georgina Quint, MD  Semaglutide-Weight Management [facility]) 0.25 MG/0.5ML SOAJ Inject 0.25 mg into the skin once a week. 03/18/23   Georgina Quint, MD    Allergies  Allergen Reactions   Sulfa Antibiotics Rash    Patient Active Problem List   Diagnosis Date Noted   Class 2 severe obesity due to excess calories with serious comorbidity and body mass index (BMI) of 37.0 to 37.9 in adult [facility]) 03/09/2023   Dyslipidemia 09/16/2021   Essential hypertension 12/10/2020   Prediabetes 12/10/2020    Past Medical History:  Diagnosis Date   Clotting disorder (HCC)    changed BCP and it caused blood clots   Colon polyps    history of colon polyps   H/O blood clots 2012   leg   Heart murmur    Hyperlipidemia    Hypertension     Past Surgical  History:  Procedure Laterality Date   COLONOSCOPY W/ POLYPECTOMY     NO PAST SURGERIES      Social History   Socioeconomic History   Marital status: Married    Spouse name: Not on file   Number of children: Not on file   Years of education: Not on file   Highest education level: Not on file  Occupational History   Not on file  Tobacco Use   Smoking status: Never   Smokeless tobacco: Never  Vaping Use   Vaping status: Never Used  Substance and Sexual Activity   Alcohol use: No   Drug use: No   Sexual activity: Not on file  Other Topics Concern   Not on file  Social History Narrative   Not on file   Social Determinants of Health   Financial Resource Strain: Not on file  Food Insecurity: Not on file  Transportation Needs: Not on file  Physical Activity: Not on file  Stress: Not on file  Social Connections: Not on file  Intimate Partner Violence: Not on file    Family History  Problem Relation Age of Onset   Colon polyps Father    Colon cancer Father 40   Esophageal cancer Neg Hx    Rectal cancer Neg Hx    Stomach cancer Neg Hx  Review of Systems  Constitutional: Negative.  Negative for chills and fever.  HENT: Negative.  Negative for congestion and sore throat.   Respiratory: Negative.  Negative for cough and shortness of breath.   Cardiovascular: Negative.  Negative for chest pain and palpitations.  Gastrointestinal:  Negative for abdominal pain, diarrhea, nausea and vomiting.  Genitourinary: Negative.  Negative for dysuria and hematuria.  Skin: Negative.  Negative for rash.  Neurological: Negative.  Negative for dizziness and headaches.  All other systems reviewed and are negative.   Today's Vitals   06/13/23 1558  BP: 124/72  Pulse: 65  Temp: 97.8 F (36.6 C)  TempSrc: Oral  SpO2: 95%  Weight: 222 lb (100.7 kg)  Height: 5\' 5"  (1.651 m)   Body mass index is 36.94 kg/m.   Physical Exam Vitals reviewed.  Constitutional:       Appearance: Normal appearance.  HENT:     Head: Normocephalic.  Eyes:     Extraocular Movements: Extraocular movements intact.  Cardiovascular:     Rate and Rhythm: Normal rate and regular rhythm.     Pulses: Normal pulses.     Heart sounds: Normal heart sounds.  Pulmonary:     Effort: Pulmonary effort is normal.  Musculoskeletal:     Cervical back: No tenderness.  Lymphadenopathy:     Cervical: No cervical adenopathy.  Skin:    General: Skin is warm and dry.     Capillary Refill: Capillary refill takes less than 2 seconds.  Neurological:     General: No focal deficit present.     Mental Status: She is alert and oriented to person, place, and time.  Psychiatric:        Behavior: Behavior normal.      ASSESSMENT & PLAN: A total of 42 minutes was spent with the patient and counseling/coordination of care regarding preparing for this visit, review of most recent office visit notes, review of multiple chronic medical conditions and their management, cardiovascular risks associated with hypertension and dyslipidemia, review of all medications and changes made, review of most recent bloodwork results, review of health maintenance items, education on nutrition, prognosis, documentation, and need for follow up.   Problem List Items Addressed This Visit       Cardiovascular and Mediastinum   Essential hypertension - Primary    BP Readings from Last 3 Encounters:  06/13/23 124/72  03/09/23 124/72  09/08/22 130/76  1 control hypertension Continue Hyzaar 50-12.5 mg daily Cardiovascular risks associated with hypertension discussed Diet and nutrition discussed         Other   Prediabetes    Diet and nutrition discussed       Dyslipidemia    Chronic stable condition Diet and nutrition discussed Continue rosuvastatin 10 mg daily      Class 2 severe obesity due to excess calories with serious comorbidity and body mass index (BMI) of 37.0 to 37.9 in adult [facility])    Started  generic form of Wegovy Tolerating side effects well Diet and nutrition discussed Reginal Lutes however not covered by insurance Recommend start Zepbound 7.5 mg weekly Wt Readings from Last 3 Encounters:  06/13/23 222 lb (100.7 kg)  03/09/23 226 lb 6 oz (102.7 kg)  09/08/22 224 lb 8 oz (101.8 kg)         Relevant Medications   tirzepatide (ZEPBOUND) 7.5 MG/0.5ML Pen   Patient Instructions  Health Maintenance, Female Adopting a healthy lifestyle and getting preventive care are important in promoting health and wellness. Ask  your health care provider about: The right schedule for you to have regular tests and exams. Things you can do on your own to prevent diseases and keep yourself healthy. What should I know about diet, weight, and exercise? Eat a healthy diet  Eat a diet that includes plenty of vegetables, fruits, low-fat dairy products, and lean protein. Do not eat a lot of foods that are high in solid fats, added sugars, or sodium. Maintain a healthy weight Body mass index (BMI) is used to identify weight problems. It estimates body fat based on height and weight. Your health care provider can help determine your BMI and help you achieve or maintain a healthy weight. Get regular exercise Get regular exercise. This is one of the most important things you can do for your health. Most adults should: Exercise for at least 150 minutes each week. The exercise should increase your heart rate and make you sweat (moderate-intensity exercise). Do strengthening exercises at least twice a week. This is in addition to the moderate-intensity exercise. Spend less time sitting. Even light physical activity can be beneficial. Watch cholesterol and blood lipids Have your blood tested for lipids and cholesterol at 62 years of age, then have this test every 5 years. Have your cholesterol levels checked more often if: Your lipid or cholesterol levels are high. You are older than 62 years of age. You are  at high risk for heart disease. What should I know about cancer screening? Depending on your health history and family history, you may need to have cancer screening at various ages. This may include screening for: Breast cancer. Cervical cancer. Colorectal cancer. Skin cancer. Lung cancer. What should I know about heart disease, diabetes, and high blood pressure? Blood pressure and heart disease High blood pressure causes heart disease and increases the risk of stroke. This is more likely to develop in people who have high blood pressure readings or are overweight. Have your blood pressure checked: Every 3-5 years if you are 34-59 years of age. Every year if you are 36 years old or older. Diabetes Have regular diabetes screenings. This checks your fasting blood sugar level. Have the screening done: Once every three years after age 65 if you are at a normal weight and have a low risk for diabetes. More often and at a younger age if you are overweight or have a high risk for diabetes. What should I know about preventing infection? Hepatitis B If you have a higher risk for hepatitis B, you should be screened for this virus. Talk with your health care provider to find out if you are at risk for hepatitis B infection. Hepatitis C Testing is recommended for: Everyone born from [birth year] through 1965. Anyone with known risk factors for hepatitis C. Sexually transmitted infections (STIs) Get screened for STIs, including gonorrhea and chlamydia, if: You are sexually active and are younger than 62 years of age. You are older than 62 years of age and your health care provider tells you that you are at risk for this type of infection. Your sexual activity has changed since you were last screened, and you are at increased risk for chlamydia or gonorrhea. Ask your health care provider if you are at risk. Ask your health care provider about whether you are at high risk for HIV. Your health care provider  may recommend a prescription medicine to help prevent HIV infection. If you choose to take medicine to prevent HIV, you should first get tested for HIV. You  should then be tested every 3 months for as long as you are taking the medicine. Pregnancy If you are about to stop having your period (premenopausal) and you may become pregnant, seek counseling before you get pregnant. Take 400 to 800 micrograms (mcg) of folic acid every day if you become pregnant. Ask for birth control (contraception) if you want to prevent pregnancy. Osteoporosis and menopause Osteoporosis is a disease in which the bones lose minerals and strength with aging. This can result in bone fractures. If you are 51 years old or older, or if you are at risk for osteoporosis and fractures, ask your health care provider if you should: Be screened for bone loss. Take a calcium or vitamin D supplement to lower your risk of fractures. Be given hormone replacement therapy (HRT) to treat symptoms of menopause. Follow these instructions at home: Alcohol use Do not drink alcohol if: Your health care provider tells you not to drink. You are pregnant, may be pregnant, or are planning to become pregnant. If you drink alcohol: Limit how much you have to: 0-1 drink a day. Know how much alcohol is in your drink. In the [place] one drink equals one 12 oz bottle of beer (355 mL), one 5 oz glass of wine (148 mL), or one 1 oz glass of hard liquor (44 mL). Lifestyle Do not use any products that contain nicotine or tobacco. These products include cigarettes, chewing tobacco, and vaping devices, such as e-cigarettes. If you need help quitting, ask your health care provider. Do not use street drugs. Do not share needles. Ask your health care provider for help if you need support or information about quitting drugs. General instructions Schedule regular health, dental, and eye exams. Stay current with your vaccines. Tell your health care provider  if: You often feel depressed. You have ever been abused or do not feel safe at home. Summary Adopting a healthy lifestyle and getting preventive care are important in promoting health and wellness. Follow your health care provider's instructions about healthy diet, exercising, and getting tested or screened for diseases. Follow your health care provider's instructions on monitoring your cholesterol and blood pressure. This information is not intended to replace advice given to you by your health care provider. Make sure you discuss any questions you have with your health care provider. Document Revised: 11/24/2020 Document Reviewed: 11/24/2020 Elsevier Patient Education  2024 [employer]vier Inc.     Edwina Barth, MD [facility]

## 2023-06-13 NOTE — Assessment & Plan Note (Signed)
Diet and nutrition discussed.

## 2023-06-13 NOTE — Assessment & Plan Note (Signed)
BP Readings from Last 3 Encounters:  06/13/23 124/72  03/09/23 124/72  09/08/22 130/76  1 control hypertension Continue Hyzaar 50-12.5 mg daily Cardiovascular risks associated with hypertension discussed Diet and nutrition discussed

## 2023-06-13 NOTE — Patient Instructions (Signed)

## 2023-06-14 ENCOUNTER — Telehealth: Payer: Self-pay | Admitting: Emergency Medicine

## 2023-06-14 NOTE — Telephone Encounter (Signed)
Pharmacy called needing the correct notes for the Rx for Zepbound 10mg  Pyridoxine per ml

## 2023-06-14 NOTE — Telephone Encounter (Signed)
Pharmacy called back states formula needs to say 10mg /mL plus B6 10mg  per mL, come in 3 mL vile and they convert it.

## 2023-06-15 ENCOUNTER — Other Ambulatory Visit: Payer: Self-pay | Admitting: Emergency Medicine

## 2023-06-15 MED ORDER — ZEPBOUND 7.5 MG/0.5ML ~~LOC~~ SOAJ: 7.5000 mg | SUBCUTANEOUS | 5 refills | Status: DC

## 2023-06-15 NOTE — Telephone Encounter (Signed)
New prescription sent as requested. Thanks

## 2023-09-13 ENCOUNTER — Ambulatory Visit: Payer: BC Managed Care – PPO | Admitting: Emergency Medicine

## 2023-09-13 ENCOUNTER — Encounter: Payer: Self-pay | Admitting: Emergency Medicine

## 2023-09-13 VITALS — BP 124/80 | HR 61 | Temp 97.8°F | Ht 65.0 in | Wt 213.0 lb

## 2023-09-13 DIAGNOSIS — I10 Essential (primary) hypertension: Secondary | ICD-10-CM | POA: Diagnosis not present

## 2023-09-13 DIAGNOSIS — E66812 Obesity, class 2: Secondary | ICD-10-CM

## 2023-09-13 DIAGNOSIS — R7303 Prediabetes: Secondary | ICD-10-CM | POA: Diagnosis not present

## 2023-09-13 DIAGNOSIS — Z6837 Body mass index (BMI) 37.0-37.9, adult: Secondary | ICD-10-CM

## 2023-09-13 DIAGNOSIS — E785 Hyperlipidemia, unspecified: Secondary | ICD-10-CM

## 2023-09-13 MED ORDER — VALACYCLOVIR HCL 1 G PO TABS: 1000.0000 mg | ORAL_TABLET | Freq: Two times a day (BID) | ORAL | 3 refills | Status: AC

## 2023-09-13 NOTE — Assessment & Plan Note (Signed)
 Diet and nutrition discussed.

## 2023-09-13 NOTE — Assessment & Plan Note (Signed)
 Eating better and losing weight Was able to start Zepbound 7.5 mg weekly No side effects.  Tolerating it well. Diet and nutrition discussed Continue Zepbound 7.5 mg weekly We will follow-up in 3 months.

## 2023-09-13 NOTE — Assessment & Plan Note (Signed)
Chronic stable condition Diet and nutrition discussed Continue rosuvastatin 10 mg daily

## 2023-09-13 NOTE — Assessment & Plan Note (Signed)
 BP Readings from Last 3 Encounters:  09/13/23 124/80  06/13/23 124/72  03/09/23 124/72  Well-controlled hypertension Continue Hyzaar 50-12.5 mg daily Cardiovascular risks associated with hypertension discussed Diet and nutrition discussed

## 2023-09-13 NOTE — Progress Notes (Signed)
 Renee Swanson 63 y.o.   Chief Complaint  Patient presents with   Follow-up    3 month for HTN. Patient wants a new rx for Valtrex for her flare ups.     HISTORY OF PRESENT ILLNESS: This is a 63 y.o. female A1A here for 76-month follow-up of chronic medical conditions including prediabetes, hypertension and dyslipidemia Was able to start Zepbound.  On 7.5 mg weekly with good results Eating better and losing weight Has no complaints or any other medical concerns today. Wt Readings from Last 3 Encounters:  09/13/23 213 lb (96.6 kg)  06/13/23 222 lb (100.7 kg)  03/09/23 226 lb 6 oz (102.7 kg)     HPI   Prior to Admission medications   Medication Sig Start Date End Date Taking? Authorizing Provider  aspirin 81 MG tablet Take 81 mg by mouth daily.   Yes [provider]  Cholecalciferol (VITAMIN D PO) Take 1 capsule by mouth daily. 1000 units   Yes [provider]  Cyanocobalamin (VITAMIN B12 PO) Take 1,500 mcg by mouth.   Yes [provider]  losartan-hydrochlorothiazide (HYZAAR) 50-12.5 MG tablet Take 1 tablet by mouth daily. 09/08/22  Yes Ziona Wickens, Eilleen Kempf, MD  Multiple Vitamins-Minerals (CENTRUM PO) Take 1 tablet by mouth daily.   Yes [provider]  rosuvastatin (CRESTOR) 10 MG tablet Take 1 tablet (10 mg total) by mouth daily. 09/08/22  Yes Samaira Holzworth, Eilleen Kempf, MD  tirzepatide [facility]) 7.5 MG/0.5ML Pen Inject 7.5 mg into the skin once a week. 06/15/23  Yes Georgina Quint, MD    Allergies  Allergen Reactions   Sulfa Antibiotics Rash    Patient Active Problem List   Diagnosis Date Noted   Class 2 severe obesity due to excess calories with serious comorbidity and body mass index (BMI) of 37.0 to 37.9 in adult [facility]) 03/09/2023   Dyslipidemia 09/16/2021   Essential hypertension 12/10/2020   Prediabetes 12/10/2020    Past Medical History:  Diagnosis Date   Clotting disorder (HCC)    changed BCP and it caused blood clots    Colon polyps    history of colon polyps   H/O blood clots 2012   leg   Heart murmur    Hyperlipidemia    Hypertension     Past Surgical History:  Procedure Laterality Date   COLONOSCOPY W/ POLYPECTOMY     NO PAST SURGERIES      Social History   Socioeconomic History   Marital status: Married    Spouse name: Not on file   Number of children: Not on file   Years of education: Not on file   Highest education level: Not on file  Occupational History   Not on file  Tobacco Use   Smoking status: Never   Smokeless tobacco: Never  Vaping Use   Vaping status: Never Used  Substance and Sexual Activity   Alcohol use: No   Drug use: No   Sexual activity: Not on file  Other Topics Concern   Not on file  Social History Narrative   Not on file   Social Drivers of Health   Financial Resource Strain: Not on file  Food Insecurity: Not on file  Transportation Needs: Not on file  Physical Activity: Not on file  Stress: Not on file  Social Connections: Not on file  Intimate Partner Violence: Not on file    Family History  Problem Relation Age of Onset   Colon polyps Father    Colon  cancer Father 45   Esophageal cancer Neg Hx    Rectal cancer Neg Hx    Stomach cancer Neg Hx      Review of Systems  Constitutional: Negative.  Negative for chills and fever.  HENT: Negative.  Negative for congestion and sore throat.   Respiratory: Negative.  Negative for cough and shortness of breath.   Cardiovascular: Negative.  Negative for chest pain and palpitations.  Gastrointestinal:  Negative for abdominal pain, diarrhea, nausea and vomiting.  Genitourinary: Negative.  Negative for dysuria and hematuria.  Musculoskeletal: Negative.   Skin: Negative.  Negative for rash.  Neurological: Negative.  Negative for dizziness and headaches.  All other systems reviewed and are negative.   Today's Vitals   09/13/23 1605  BP: 124/80  Pulse: 61  Temp: 97.8 F (36.6 C)  TempSrc: Oral   SpO2: 97%  Weight: 213 lb (96.6 kg)  Height: 5\' 5"  (1.651 m)   Body mass index is 35.45 kg/m.   Physical Exam Vitals reviewed.  Constitutional:      Appearance: Normal appearance.  Eyes:     Extraocular Movements: Extraocular movements intact.  Cardiovascular:     Rate and Rhythm: Normal rate.  Pulmonary:     Effort: Pulmonary effort is normal.  Skin:    General: Skin is warm and dry.  Neurological:     Mental Status: She is alert and oriented to person, place, and time.  Psychiatric:        Behavior: Behavior normal.      ASSESSMENT & PLAN: A total of 42 minutes was spent with the patient and counseling/coordination of care regarding preparing for this visit, review of most recent office visit notes, review of multiple chronic medical conditions and their management, review of all medications, review of most recent bloodwork results, review of health maintenance items, education on nutrition, prognosis, documentation, and need for follow up.   Problem List Items Addressed This Visit       Cardiovascular and Mediastinum   Essential hypertension   BP Readings from Last 3 Encounters:  09/13/23 124/80  06/13/23 124/72  03/09/23 124/72  Well-controlled hypertension Continue Hyzaar 50-12.5 mg daily Cardiovascular risks associated with hypertension discussed Diet and nutrition discussed         Other   Prediabetes   Diet and nutrition discussed       Dyslipidemia   Chronic stable condition Diet and nutrition discussed Continue rosuvastatin 10 mg daily      Class 2 severe obesity due to excess calories with serious comorbidity and body mass index (BMI) of 37.0 to 37.9 in adult [facility]) - Primary   Eating better and losing weight Was able to start Zepbound 7.5 mg weekly No side effects.  Tolerating it well. Diet and nutrition discussed Continue Zepbound 7.5 mg weekly We will follow-up in 3 months.      Patient Instructions  Health Maintenance,  Female Adopting a healthy lifestyle and getting preventive care are important in promoting health and wellness. Ask your health care provider about: The right schedule for you to have regular tests and exams. Things you can do on your own to prevent diseases and keep yourself healthy. What should I know about diet, weight, and exercise? Eat a healthy diet  Eat a diet that includes plenty of vegetables, fruits, low-fat dairy products, and lean protein. Do not eat a lot of foods that are high in solid fats, added sugars, or sodium. Maintain a healthy weight Body mass index (BMI)  is used to identify weight problems. It estimates body fat based on height and weight. Your health care provider can help determine your BMI and help you achieve or maintain a healthy weight. Get regular exercise Get regular exercise. This is one of the most important things you can do for your health. Most adults should: Exercise for at least 150 minutes each week. The exercise should increase your heart rate and make you sweat (moderate-intensity exercise). Do strengthening exercises at least twice a week. This is in addition to the moderate-intensity exercise. Spend less time sitting. Even light physical activity can be beneficial. Watch cholesterol and blood lipids Have your blood tested for lipids and cholesterol at 63 years of age, then have this test every 5 years. Have your cholesterol levels checked more often if: Your lipid or cholesterol levels are high. You are older than 63 years of age. You are at high risk for heart disease. What should I know about cancer screening? Depending on your health history and family history, you may need to have cancer screening at various ages. This may include screening for: Breast cancer. Cervical cancer. Colorectal cancer. Skin cancer. Lung cancer. What should I know about heart disease, diabetes, and high blood pressure? Blood pressure and heart disease High blood  pressure causes heart disease and increases the risk of stroke. This is more likely to develop in people who have high blood pressure readings or are overweight. Have your blood pressure checked: Every 3-5 years if you are 30-54 years of age. Every year if you are 78 years old or older. Diabetes Have regular diabetes screenings. This checks your fasting blood sugar level. Have the screening done: Once every three years after age 56 if you are at a normal weight and have a low risk for diabetes. More often and at a younger age if you are overweight or have a high risk for diabetes. What should I know about preventing infection? Hepatitis B If you have a higher risk for hepatitis B, you should be screened for this virus. Talk with your health care provider to find out if you are at risk for hepatitis B infection. Hepatitis C Testing is recommended for: Everyone born from [birth year] through 1965. Anyone with known risk factors for hepatitis C. Sexually transmitted infections (STIs) Get screened for STIs, including gonorrhea and chlamydia, if: You are sexually active and are younger than 63 years of age. You are older than 63 years of age and your health care provider tells you that you are at risk for this type of infection. Your sexual activity has changed since you were last screened, and you are at increased risk for chlamydia or gonorrhea. Ask your health care provider if you are at risk. Ask your health care provider about whether you are at high risk for HIV. Your health care provider may recommend a prescription medicine to help prevent HIV infection. If you choose to take medicine to prevent HIV, you should first get tested for HIV. You should then be tested every 3 months for as long as you are taking the medicine. Pregnancy If you are about to stop having your period (premenopausal) and you may become pregnant, seek counseling before you get pregnant. Take 400 to 800 micrograms (mcg) of folic  acid every day if you become pregnant. Ask for birth control (contraception) if you want to prevent pregnancy. Osteoporosis and menopause Osteoporosis is a disease in which the bones lose minerals and strength with aging. This can result  in bone fractures. If you are 62 years old or older, or if you are at risk for osteoporosis and fractures, ask your health care provider if you should: Be screened for bone loss. Take a calcium or vitamin D supplement to lower your risk of fractures. Be given hormone replacement therapy (HRT) to treat symptoms of menopause. Follow these instructions at home: Alcohol use Do not drink alcohol if: Your health care provider tells you not to drink. You are pregnant, may be pregnant, or are planning to become pregnant. If you drink alcohol: Limit how much you have to: 0-1 drink a day. Know how much alcohol is in your drink. In the [place] one drink equals one 12 oz bottle of beer (355 mL), one 5 oz glass of wine (148 mL), or one 1 oz glass of hard liquor (44 mL). Lifestyle Do not use any products that contain nicotine or tobacco. These products include cigarettes, chewing tobacco, and vaping devices, such as e-cigarettes. If you need help quitting, ask your health care provider. Do not use street drugs. Do not share needles. Ask your health care provider for help if you need support or information about quitting drugs. General instructions Schedule regular health, dental, and eye exams. Stay current with your vaccines. Tell your health care provider if: You often feel depressed. You have ever been abused or do not feel safe at home. Summary Adopting a healthy lifestyle and getting preventive care are important in promoting health and wellness. Follow your health care provider's instructions about healthy diet, exercising, and getting tested or screened for diseases. Follow your health care provider's instructions on monitoring your cholesterol and blood  pressure. This information is not intended to replace advice given to you by your health care provider. Make sure you discuss any questions you have with your health care provider. Document Revised: 11/24/2020 Document Reviewed: 11/24/2020 Elsevier Patient Education  2024 [employer]vier [employer].    Edwina Barth, MD [facility]

## 2023-09-13 NOTE — Patient Instructions (Signed)

## 2023-09-20 ENCOUNTER — Other Ambulatory Visit: Payer: Self-pay | Admitting: Emergency Medicine

## 2023-09-20 DIAGNOSIS — E785 Hyperlipidemia, unspecified: Secondary | ICD-10-CM

## 2023-11-12 ENCOUNTER — Other Ambulatory Visit: Payer: Self-pay | Admitting: Emergency Medicine

## 2023-11-12 DIAGNOSIS — I10 Essential (primary) hypertension: Secondary | ICD-10-CM

## 2023-11-30 ENCOUNTER — Telehealth: Payer: Self-pay | Admitting: Emergency Medicine

## 2023-11-30 NOTE — Telephone Encounter (Unsigned)
 Copied from CRM [id number]. Topic: Clinical - Medication Refill >> Nov 30, 2023  8:39 AM Trula Gable C wrote: Medication: tirzepatide  (ZEPBOUND ) 7.5 MG/0.5ML Pen  Has the patient contacted their pharmacy? Yes (Agent: If no, request that the patient contact the pharmacy for the refill. If patient does not wish to contact the pharmacy document the reason why and proceed with request.) (Agent: If yes, when and what did the pharmacy advise?)  This is the patient's preferred pharmacy:   [facility] - [place], [place] - [address]es [address] [place] [address] Phone: [phone] Fax: [phone]  Is this the correct pharmacy for this prescription? Yes If no, delete pharmacy and type the correct one.   Has the prescription been filled recently? No  Is the patient out of the medication? Yes  Has the patient been seen for an appointment in the last year OR does the patient have an upcoming appointment? Yes  Can we respond through MyChart? Yes  Agent: Please be advised that Rx refills may take up to 3 business days. We ask that you follow-up with your pharmacy.

## 2023-12-01 ENCOUNTER — Telehealth: Payer: Self-pay | Admitting: Emergency Medicine

## 2023-12-01 MED ORDER — ZEPBOUND 7.5 MG/0.5ML ~~LOC~~ SOAJ
7.5000 mg | SUBCUTANEOUS | 5 refills | Status: AC
Start: 2023-12-01 — End: ?

## 2023-12-01 NOTE — Telephone Encounter (Unsigned)
 Copied from CRM [id number]. Topic: Clinical - Medication Question >> Dec 01, 2023 10:34 AM Renee Swanson wrote: Reason for CRM: [facility] called to advise pt request tirzepatide  (ZEPBOUND ) 7.5 MG/0.5ML Pen be increase to 10mg , pharmacy can be reached [phone]

## 2023-12-14 ENCOUNTER — Encounter: Payer: Self-pay | Admitting: Emergency Medicine

## 2023-12-14 ENCOUNTER — Ambulatory Visit: Payer: BC Managed Care – PPO | Admitting: Emergency Medicine

## 2023-12-14 VITALS — BP 128/78 | HR 69 | Temp 97.9°F | Ht 65.0 in | Wt 208.0 lb

## 2023-12-14 DIAGNOSIS — R7303 Prediabetes: Secondary | ICD-10-CM

## 2023-12-14 DIAGNOSIS — E66812 Obesity, class 2: Secondary | ICD-10-CM | POA: Diagnosis not present

## 2023-12-14 DIAGNOSIS — E785 Hyperlipidemia, unspecified: Secondary | ICD-10-CM

## 2023-12-14 DIAGNOSIS — Z6837 Body mass index (BMI) 37.0-37.9, adult: Secondary | ICD-10-CM

## 2023-12-14 DIAGNOSIS — I10 Essential (primary) hypertension: Secondary | ICD-10-CM

## 2023-12-14 LAB — CBC WITH DIFFERENTIAL/PLATELET
Basophils Absolute: 0.1 10*3/uL (ref 0.0–0.1)
Basophils Relative: 0.7 % (ref 0.0–3.0)
Eosinophils Absolute: 0.1 10*3/uL (ref 0.0–0.7)
Eosinophils Relative: 1.7 % (ref 0.0–5.0)
HCT: 39.4 % (ref 36.0–46.0)
Hemoglobin: 13.6 g/dL (ref 12.0–15.0)
Lymphocytes Relative: 42.8 % (ref 12.0–46.0)
Lymphs Abs: 3.2 10*3/uL (ref 0.7–4.0)
MCHC: 34.5 g/dL (ref 30.0–36.0)
MCV: 88 fl (ref 78.0–100.0)
Monocytes Absolute: 0.6 10*3/uL (ref 0.1–1.0)
Monocytes Relative: 8.3 % (ref 3.0–12.0)
Neutro Abs: 3.5 10*3/uL (ref 1.4–7.7)
Neutrophils Relative %: 46.5 % (ref 43.0–77.0)
Platelets: 228 10*3/uL (ref 150.0–400.0)
RBC: 4.48 Mil/uL (ref 3.87–5.11)
RDW: 12.4 % (ref 11.5–15.5)
WBC: 7.5 10*3/uL (ref 4.0–10.5)

## 2023-12-14 LAB — COMPREHENSIVE METABOLIC PANEL WITH GFR
ALT: 21 U/L (ref 0–35)
AST: 16 U/L (ref 0–37)
Albumin: 4.7 g/dL (ref 3.5–5.2)
Alkaline Phosphatase: 37 U/L — ABNORMAL LOW (ref 39–117)
BUN: 11 mg/dL (ref 6–23)
CO2: 32 meq/L (ref 19–32)
Calcium: 9.7 mg/dL (ref 8.4–10.5)
Chloride: 98 meq/L (ref 96–112)
Creatinine, Ser: 0.72 mg/dL (ref 0.40–1.20)
GFR: 89.41 mL/min (ref >60.00)
Glucose, Bld: 90 mg/dL (ref 70–99)
Potassium: 3.5 meq/L (ref 3.5–5.1)
Sodium: 136 meq/L (ref 135–145)
Total Bilirubin: 0.5 mg/dL (ref 0.2–1.2)
Total Protein: 7 g/dL (ref 6.0–8.3)

## 2023-12-14 LAB — LIPID PANEL
Cholesterol: 149 mg/dL (ref 0–200)
HDL: 60.4 mg/dL (ref >39.00)
LDL Cholesterol: 59 mg/dL (ref 0–99)
NonHDL: 88.95
Total CHOL/HDL Ratio: 2
Triglycerides: 152 mg/dL — ABNORMAL HIGH (ref 0.0–149.0)
VLDL: 30.4 mg/dL (ref 0.0–40.0)

## 2023-12-14 NOTE — Assessment & Plan Note (Signed)
 Eating better and losing weight Was able to start Zepbound  7.5 mg weekly No side effects.  Tolerating it well. Diet and nutrition discussed Continue Zepbound  7.5 mg weekly We will follow-up in 6 months.

## 2023-12-14 NOTE — Progress Notes (Signed)
 Renee Swanson 63 y.o.   Chief Complaint  Patient presents with   Follow-up    3 month f/u for HTN. No concerns    HISTORY OF PRESENT ILLNESS: This is a 63 y.o. female here for 49-month follow-up of chronic medical conditions including hypertension and prediabetes Overall doing well.  Has no complaints or medical concerns today. Wt Readings from Last 3 Encounters:  12/14/23 208 lb (94.3 kg)  09/13/23 213 lb (96.6 kg)  06/13/23 222 lb (100.7 kg)   BP Readings from Last 3 Encounters:  12/14/23 128/78  09/13/23 124/80  06/13/23 124/72     HPI   Prior to Admission medications   Medication Sig Start Date End Date Taking? Authorizing Provider  aspirin 81 MG tablet Take 81 mg by mouth daily.   Yes [provider]  Cholecalciferol (VITAMIN D PO) Take 1 capsule by mouth daily. 1000 units   Yes [provider]  Cyanocobalamin (VITAMIN B12 PO) Take 1,500 mcg by mouth.   Yes [provider]  losartan -hydrochlorothiazide (HYZAAR) 50-12.5 MG tablet TAKE 1 TABLET BY MOUTH EVERY DAY 11/12/23  Yes Varshini Arrants, Isidro Margo, MD  Multiple Vitamins-Minerals (CENTRUM PO) Take 1 tablet by mouth daily.   Yes [provider]  rosuvastatin  (CRESTOR ) 10 MG tablet TAKE 1 TABLET BY MOUTH EVERY DAY 09/20/23  Yes Jacelynn Hayton, Isidro Margo, MD  tirzepatide  [facility]) 10 MG/0.5ML Pen Inject 10 mg into the skin once a week.   Yes [provider]  valACYclovir  (VALTREX ) 1000 MG tablet Take 1 tablet (1,000 mg total) by mouth 2 (two) times daily. 09/13/23  Yes Alivya Wegman, Isidro Margo, MD  tirzepatide  (ZEPBOUND ) 7.5 MG/0.5ML Pen Inject 7.5 mg into the skin once a week. Patient not taking: Reported on 12/14/2023 12/01/23   Elvira Hammersmith, MD    Allergies  Allergen Reactions   Sulfa Antibiotics Rash    Patient Active Problem List   Diagnosis Date Noted   Class 2 severe obesity due to excess calories with serious comorbidity and body mass index (BMI) of 37.0 to 37.9 in  adult [facility]) 03/09/2023   Dyslipidemia 09/16/2021   Essential hypertension 12/10/2020   Prediabetes 12/10/2020    Past Medical History:  Diagnosis Date   Clotting disorder (HCC)    changed BCP and it caused blood clots   Colon polyps    history of colon polyps   H/O blood clots 2012   leg   Heart murmur    Hyperlipidemia    Hypertension     Past Surgical History:  Procedure Laterality Date   COLONOSCOPY W/ POLYPECTOMY     NO PAST SURGERIES      Social History   Socioeconomic History   Marital status: Married    Spouse name: Not on file   Number of children: Not on file   Years of education: Not on file   Highest education level: Not on file  Occupational History   Not on file  Tobacco Use   Smoking status: Never   Smokeless tobacco: Never  Vaping Use   Vaping status: Never Used  Substance and Sexual Activity   Alcohol use: No   Drug use: No   Sexual activity: Not on file  Other Topics Concern   Not on file  Social History Narrative   Not on file   Social Drivers of Health   Financial Resource Strain: Not on file  Food Insecurity: Not on file  Transportation Needs: Not on file  Physical Activity: Not on  file  Stress: Not on file  Social Connections: Not on file  Intimate Partner Violence: Not on file    Family History  Problem Relation Age of Onset   Colon polyps Father    Colon cancer Father 60   Esophageal cancer Neg Hx    Rectal cancer Neg Hx    Stomach cancer Neg Hx      Review of Systems  Constitutional: Negative.  Negative for chills and fever.  HENT: Negative.  Negative for congestion and sore throat.   Respiratory: Negative.  Negative for cough and shortness of breath.   Cardiovascular: Negative.  Negative for chest pain and palpitations.  Gastrointestinal:  Negative for abdominal pain, diarrhea, nausea and vomiting.  Genitourinary: Negative.  Negative for dysuria and hematuria.  Skin: Negative.  Negative for rash.  Neurological:  Negative.  Negative for dizziness and headaches.  All other systems reviewed and are negative.   Vitals:   12/14/23 1541  BP: 128/78  Pulse: 69  Temp: 97.9 F (36.6 C)  SpO2: 99%    Physical Exam Vitals reviewed.  Constitutional:      Appearance: Normal appearance.  HENT:     Head: Normocephalic.  Eyes:     Extraocular Movements: Extraocular movements intact.  Cardiovascular:     Rate and Rhythm: Normal rate and regular rhythm.     Pulses: Normal pulses.     Heart sounds: Normal heart sounds.  Pulmonary:     Effort: Pulmonary effort is normal.     Breath sounds: Normal breath sounds.  Skin:    General: Skin is warm and dry.     Capillary Refill: Capillary refill takes less than 2 seconds.  Neurological:     General: No focal deficit present.     Mental Status: She is alert and oriented to person, place, and time.  Psychiatric:        Mood and Affect: Mood normal.        Behavior: Behavior normal.      ASSESSMENT & PLAN: A total of 44 minutes was spent with the patient and counseling/coordination of care regarding preparing for this visit, review of most recent office visit notes, review of multiple chronic medical conditions and their management, review of all medications, review of most recent bloodwork results, review of health maintenance items, education on nutrition, prognosis, documentation, and need for follow up.   Problem List Items Addressed This Visit       Cardiovascular and Mediastinum   Essential hypertension - Primary   BP Readings from Last 3 Encounters:  12/14/23 128/78  09/13/23 124/80  06/13/23 124/72  Well-controlled hypertension Continue Hyzaar 50-12.5 mg daily Cardiovascular risks associated with hypertension discussed Diet and nutrition discussed      Relevant Orders   CBC with Differential/Platelet   Comprehensive metabolic panel with GFR   Hemoglobin A1c   Lipid panel     Other   Prediabetes   Diet and nutrition discussed   Blood work done today including hemoglobin A1c      Relevant Orders   CBC with Differential/Platelet   Comprehensive metabolic panel with GFR   Hemoglobin A1c   Lipid panel   Dyslipidemia   Chronic stable condition Diet and nutrition discussed Continue rosuvastatin  10 mg daily Lipid profile done today along with CMP      Relevant Orders   CBC with Differential/Platelet   Comprehensive metabolic panel with GFR   Hemoglobin A1c   Lipid panel   Class 2 severe obesity  due to excess calories with serious comorbidity and body mass index (BMI) of 37.0 to 37.9 in adult [facility])   Eating better and losing weight Was able to start Zepbound  7.5 mg weekly No side effects.  Tolerating it well. Diet and nutrition discussed Continue Zepbound  7.5 mg weekly We will follow-up in 6 months.      Relevant Medications   tirzepatide  (MOUNJARO) 10 MG/0.5ML Pen   Patient Instructions  Health Maintenance, Female Adopting a healthy lifestyle and getting preventive care are important in promoting health and wellness. Ask your health care provider about: The right schedule for you to have regular tests and exams. Things you can do on your own to prevent diseases and keep yourself healthy. What should I know about diet, weight, and exercise? Eat a healthy diet  Eat a diet that includes plenty of vegetables, fruits, low-fat dairy products, and lean protein. Do not eat a lot of foods that are high in solid fats, added sugars, or sodium. Maintain a healthy weight Body mass index (BMI) is used to identify weight problems. It estimates body fat based on height and weight. Your health care provider can help determine your BMI and help you achieve or maintain a healthy weight. Get regular exercise Get regular exercise. This is one of the most important things you can do for your health. Most adults should: Exercise for at least 150 minutes each week. The exercise should increase your heart rate and make you  sweat (moderate-intensity exercise). Do strengthening exercises at least twice a week. This is in addition to the moderate-intensity exercise. Spend less time sitting. Even light physical activity can be beneficial. Watch cholesterol and blood lipids Have your blood tested for lipids and cholesterol at 63 years of age, then have this test every 5 years. Have your cholesterol levels checked more often if: Your lipid or cholesterol levels are high. You are older than 63 years of age. You are at high risk for heart disease. What should I know about cancer screening? Depending on your health history and family history, you may need to have cancer screening at various ages. This may include screening for: Breast cancer. Cervical cancer. Colorectal cancer. Skin cancer. Lung cancer. What should I know about heart disease, diabetes, and high blood pressure? Blood pressure and heart disease High blood pressure causes heart disease and increases the risk of stroke. This is more likely to develop in people who have high blood pressure readings or are overweight. Have your blood pressure checked: Every 3-5 years if you are 54-16 years of age. Every year if you are 56 years old or older. Diabetes Have regular diabetes screenings. This checks your fasting blood sugar level. Have the screening done: Once every three years after age 17 if you are at a normal weight and have a low risk for diabetes. More often and at a younger age if you are overweight or have a high risk for diabetes. What should I know about preventing infection? Hepatitis B If you have a higher risk for hepatitis B, you should be screened for this virus. Talk with your health care provider to find out if you are at risk for hepatitis B infection. Hepatitis C Testing is recommended for: Everyone born from [birth year] through 1965. Anyone with known risk factors for hepatitis C. Sexually transmitted infections (STIs) Get screened for  STIs, including gonorrhea and chlamydia, if: You are sexually active and are younger than 63 years of age. You are older than 63 years  of age and your health care provider tells you that you are at risk for this type of infection. Your sexual activity has changed since you were last screened, and you are at increased risk for chlamydia or gonorrhea. Ask your health care provider if you are at risk. Ask your health care provider about whether you are at high risk for HIV. Your health care provider may recommend a prescription medicine to help prevent HIV infection. If you choose to take medicine to prevent HIV, you should first get tested for HIV. You should then be tested every 3 months for as long as you are taking the medicine. Pregnancy If you are about to stop having your period (premenopausal) and you may become pregnant, seek counseling before you get pregnant. Take 400 to 800 micrograms (mcg) of folic acid every day if you become pregnant. Ask for birth control (contraception) if you want to prevent pregnancy. Osteoporosis and menopause Osteoporosis is a disease in which the bones lose minerals and strength with aging. This can result in bone fractures. If you are 92 years old or older, or if you are at risk for osteoporosis and fractures, ask your health care provider if you should: Be screened for bone loss. Take a calcium  or vitamin D supplement to lower your risk of fractures. Be given hormone replacement therapy (HRT) to treat symptoms of menopause. Follow these instructions at home: Alcohol use Do not drink alcohol if: Your health care provider tells you not to drink. You are pregnant, may be pregnant, or are planning to become pregnant. If you drink alcohol: Limit how much you have to: 0-1 drink a day. Know how much alcohol is in your drink. In the [place] one drink equals one 12 oz bottle of beer (355 mL), one 5 oz glass of wine (148 mL), or one 1 oz glass of hard liquor (44  mL). Lifestyle Do not use any products that contain nicotine or tobacco. These products include cigarettes, chewing tobacco, and vaping devices, such as e-cigarettes. If you need help quitting, ask your health care provider. Do not use street drugs. Do not share needles. Ask your health care provider for help if you need support or information about quitting drugs. General instructions Schedule regular health, dental, and eye exams. Stay current with your vaccines. Tell your health care provider if: You often feel depressed. You have ever been abused or do not feel safe at home. Summary Adopting a healthy lifestyle and getting preventive care are important in promoting health and wellness. Follow your health care provider's instructions about healthy diet, exercising, and getting tested or screened for diseases. Follow your health care provider's instructions on monitoring your cholesterol and blood pressure. This information is not intended to replace advice given to you by your health care provider. Make sure you discuss any questions you have with your health care provider. Document Revised: 11/24/2020 Document Reviewed: 11/24/2020 Elsevier Patient Education  2024 Elsevier Inc.      Maryagnes Small, MD [facility]

## 2023-12-14 NOTE — Patient Instructions (Signed)

## 2023-12-14 NOTE — Assessment & Plan Note (Signed)
 BP Readings from Last 3 Encounters:  12/14/23 128/78  09/13/23 124/80  06/13/23 124/72  Well-controlled hypertension Continue Hyzaar 50-12.5 mg daily Cardiovascular risks associated with hypertension discussed Diet and nutrition discussed

## 2023-12-14 NOTE — Assessment & Plan Note (Signed)
Diet and nutrition discussed Blood work done today including hemoglobin A1c

## 2023-12-14 NOTE — Assessment & Plan Note (Signed)
 Chronic stable condition Diet and nutrition discussed Continue rosuvastatin  10 mg daily Lipid profile done today along with CMP

## 2023-12-15 ENCOUNTER — Ambulatory Visit: Payer: Self-pay | Admitting: Emergency Medicine

## 2023-12-15 LAB — HEMOGLOBIN A1C: Hgb A1c MFr Bld: 5.8 % (ref 4.6–6.5)

## 2023-12-30 ENCOUNTER — Telehealth: Payer: Self-pay | Admitting: Emergency Medicine

## 2023-12-30 NOTE — Telephone Encounter (Signed)
 Copied from CRM [id number]. Topic: Clinical - Lab/Test Results >> Dec 30, 2023  8:46 AM Pam Bode wrote: Reason for CRM: Patient stating she wants to tell the nurse sorry due to getting lab results and because she has missed calls. She wants to state she is sorry it was her fault for missing the calls and everything. She have them in mail. But she wants to know what does she need to work on far as the results . She also stated to call her work phone as well at [phone] when you call ask for the patient.

## 2023-12-30 NOTE — Telephone Encounter (Signed)
 Spoke with patient and informed her results were good and no concerns and no changes are needed. She understood with no further questions.

## 2024-03-15 DIAGNOSIS — Z01419 Encounter for gynecological examination (general) (routine) without abnormal findings: Secondary | ICD-10-CM | POA: Diagnosis not present

## 2024-03-15 DIAGNOSIS — Z1231 Encounter for screening mammogram for malignant neoplasm of breast: Secondary | ICD-10-CM | POA: Diagnosis not present

## 2024-03-15 DIAGNOSIS — Z6833 Body mass index (BMI) 33.0-33.9, adult: Secondary | ICD-10-CM | POA: Diagnosis not present

## 2024-06-19 ENCOUNTER — Encounter: Payer: Self-pay | Admitting: Emergency Medicine

## 2024-06-19 ENCOUNTER — Ambulatory Visit: Admitting: Emergency Medicine

## 2024-06-19 ENCOUNTER — Telehealth: Payer: Self-pay

## 2024-06-19 VITALS — BP 124/80 | HR 69 | Temp 97.7°F | Ht 65.0 in | Wt 200.0 lb

## 2024-06-19 DIAGNOSIS — R7303 Prediabetes: Secondary | ICD-10-CM | POA: Diagnosis not present

## 2024-06-19 DIAGNOSIS — Z6837 Body mass index (BMI) 37.0-37.9, adult: Secondary | ICD-10-CM | POA: Diagnosis not present

## 2024-06-19 DIAGNOSIS — I10 Essential (primary) hypertension: Secondary | ICD-10-CM | POA: Diagnosis not present

## 2024-06-19 DIAGNOSIS — E66812 Obesity, class 2: Secondary | ICD-10-CM

## 2024-06-19 DIAGNOSIS — E785 Hyperlipidemia, unspecified: Secondary | ICD-10-CM

## 2024-06-19 LAB — POCT GLYCOSYLATED HEMOGLOBIN (HGB A1C): HbA1c POC (<> result, manual entry): 5.3 % (ref 4.0–5.6)

## 2024-06-19 MED ORDER — TIRZEPATIDE 10 MG/0.5ML ~~LOC~~ SOAJ: 10.0000 mg | SUBCUTANEOUS | 5 refills | Status: DC

## 2024-06-19 NOTE — Telephone Encounter (Signed)
 Copied from CRM [id number]. Topic: Clinical - Prescription Issue >> Jun 19, 2024  4:23 PM China J wrote: Reason for CRM: A pharmacist is calling to let Dr. Purcell know that liposlim needs to be listed on the script since the pharmacy is a compound pharmacy and technically does not carry the tirzepatide .  For any follow up questions please call [phone]

## 2024-06-19 NOTE — Assessment & Plan Note (Addendum)
 Diet and nutrition discussed  Hemoglobin A1c of 5.3 today. Cardiovascular risks associated with diabetes discussed.

## 2024-06-19 NOTE — Progress Notes (Signed)
 Wt Readings from Last 3 Encounters:  06/19/24 200 lb (90.7 kg)  12/14/23 208 lb (94.3 kg)  09/13/23 213 lb (96.6 kg)   Renee Swanson 63 y.o.   Chief Complaint  Patient presents with   Follow-up    HISTORY OF PRESENT ILLNESS: This is a 63 y.o. female here for 57-month follow-up of multiple chronic medical conditions. Overall doing well.  Has no complaints or medical concerns today.  HPI   Prior to Admission medications   Medication Sig Start Date End Date Taking? Authorizing Provider  aspirin 81 MG tablet Take 81 mg by mouth daily.   Yes [provider]  Cholecalciferol (VITAMIN D PO) Take 1 capsule by mouth daily. 1000 units   Yes [provider]  Cyanocobalamin (VITAMIN B12 PO) Take 1,500 mcg by mouth.   Yes [provider]  losartan -hydrochlorothiazide (HYZAAR) 50-12.5 MG tablet TAKE 1 TABLET BY MOUTH EVERY DAY 11/12/23  Yes Janazia Schreier, Emil Schanz, MD  Multiple Vitamins-Minerals (CENTRUM PO) Take 1 tablet by mouth daily.   Yes [provider]  rosuvastatin  (CRESTOR ) 10 MG tablet TAKE 1 TABLET BY MOUTH EVERY DAY 09/20/23  Yes Matina Rodier, Emil Schanz, MD  tirzepatide  [facility]) 10 MG/0.5ML Pen Inject 10 mg into the skin once a week.   Yes [provider]  valACYclovir  (VALTREX ) 1000 MG tablet Take 1 tablet (1,000 mg total) by mouth 2 (two) times daily. 09/13/23  Yes Briunna Leicht, Emil Schanz, MD  tirzepatide  (ZEPBOUND ) 7.5 MG/0.5ML Pen Inject 7.5 mg into the skin once a week. Patient not taking: Reported on 06/19/2024 12/01/23   [facility] Emil Schanz, MD    Allergies  Allergen Reactions   Sulfa Antibiotics Rash    Patient Active Problem List   Diagnosis Date Noted   Class 2 severe obesity due to excess calories with serious comorbidity and body mass index (BMI) of 37.0 to 37.9 in adult 03/09/2023   Dyslipidemia 09/16/2021   Essential hypertension 12/10/2020   Prediabetes 12/10/2020    Past Medical History:  Diagnosis Date   Clotting  disorder    changed BCP and it caused blood clots   Colon polyps    history of colon polyps   H/O blood clots 2012   leg   Heart murmur    Hyperlipidemia    Hypertension     Past Surgical History:  Procedure Laterality Date   COLONOSCOPY W/ POLYPECTOMY     NO PAST SURGERIES      Social History   Socioeconomic History   Marital status: Married    Spouse name: Not on file   Number of children: Not on file   Years of education: Not on file   Highest education level: Not on file  Occupational History   Not on file  Tobacco Use   Smoking status: Never   Smokeless tobacco: Never  Vaping Use   Vaping status: Never Used  Substance and Sexual Activity   Alcohol use: No   Drug use: No   Sexual activity: Not on file  Other Topics Concern   Not on file  Social History Narrative   Not on file   Social Drivers of Health   Financial Resource Strain: Not on file  Food Insecurity: Not on file  Transportation Needs: Not on file  Physical Activity: Not on file  Stress: Not on file  Social Connections: Not on file  Intimate Partner Violence: Not on file    Family History  Problem Relation Age of Onset   Colon  polyps Father    Colon cancer Father 69   Esophageal cancer Neg Hx    Rectal cancer Neg Hx    Stomach cancer Neg Hx      Review of Systems  Constitutional: Negative.  Negative for chills and fever.  HENT: Negative.  Negative for congestion and sore throat.   Respiratory: Negative.  Negative for cough and shortness of breath.   Cardiovascular: Negative.  Negative for chest pain and palpitations.  Gastrointestinal:  Negative for abdominal pain, diarrhea, nausea and vomiting.  Genitourinary: Negative.  Negative for dysuria and hematuria.  Skin: Negative.  Negative for rash.  Neurological: Negative.  Negative for dizziness and headaches.  All other systems reviewed and are negative.   Vitals:   06/19/24 1546  BP: 124/80  Pulse: 69  Temp: 97.7 F (36.5 C)   SpO2: 96%    Physical Exam Vitals reviewed.  Constitutional:      Appearance: Normal appearance.  HENT:     Head: Normocephalic.  Eyes:     Extraocular Movements: Extraocular movements intact.     Pupils: Pupils are equal, round, and reactive to light.  Cardiovascular:     Rate and Rhythm: Normal rate.  Pulmonary:     Effort: Pulmonary effort is normal.  Skin:    General: Skin is warm and dry.  Neurological:     Mental Status: She is alert and oriented to person, place, and time.  Psychiatric:        Mood and Affect: Mood normal.        Behavior: Behavior normal.    Results for orders placed or performed in visit on 06/19/24 (from the past 24 hours)  POCT HgB A1C     Status: Normal   Collection Time: 06/19/24  4:02 PM  Result Value Ref Range   Hemoglobin A1C     HbA1c POC (<> result, manual entry) 5.3 4.0 - 5.6 %   HbA1c, POC (prediabetic range)     HbA1c, POC (controlled diabetic range)       ASSESSMENT & PLAN: A total of 40 minutes was spent with the patient and counseling/coordination of care regarding preparing for this visit, review of most recent office visit notes, review of multiple chronic medical conditions and their management, review of all medications, review of most recent bloodwork results including interpretation of today's hemoglobin A1c, review of health maintenance items, education on nutrition, prognosis, documentation, and need for follow up.  Problem List Items Addressed This Visit       Cardiovascular and Mediastinum   Essential hypertension - Primary   BP Readings from Last 3 Encounters:  06/19/24 124/80  12/14/23 128/78  09/13/23 124/80  Well-controlled hypertension Continue Hyzaar 50-12.5 mg daily Cardiovascular risks associated with hypertension discussed Diet and nutrition discussed         Other   Prediabetes   Diet and nutrition discussed  Hemoglobin A1c of 5.3 today. Cardiovascular risks associated with diabetes discussed.       Relevant Orders   POCT HgB A1C (Completed)   Dyslipidemia   Chronic stable condition Diet and nutrition discussed Continue rosuvastatin  10 mg daily      Class 2 severe obesity due to excess calories with serious comorbidity and body mass index (BMI) of 37.0 to 37.9 in adult   Wt Readings from Last 3 Encounters:  06/19/24 200 lb (90.7 kg)  12/14/23 208 lb (94.3 kg)  09/13/23 213 lb (96.6 kg)  Eating better and losing weight successfully Continue Zepbound   10 mg weekly Benefits of exercise discussed Diet and nutrition discussed       Relevant Medications   tirzepatide  (MOUNJARO) 10 MG/0.5ML Pen     Patient Instructions  Health Maintenance, Female Adopting a healthy lifestyle and getting preventive care are important in promoting health and wellness. Ask your health care provider about: The right schedule for you to have regular tests and exams. Things you can do on your own to prevent diseases and keep yourself healthy. What should I know about diet, weight, and exercise? Eat a healthy diet  Eat a diet that includes plenty of vegetables, fruits, low-fat dairy products, and lean protein. Do not eat a lot of foods that are high in solid fats, added sugars, or sodium. Maintain a healthy weight Body mass index (BMI) is used to identify weight problems. It estimates body fat based on height and weight. Your health care provider can help determine your BMI and help you achieve or maintain a healthy weight. Get regular exercise Get regular exercise. This is one of the most important things you can do for your health. Most adults should: Exercise for at least 150 minutes each week. The exercise should increase your heart rate and make you sweat (moderate-intensity exercise). Do strengthening exercises at least twice a week. This is in addition to the moderate-intensity exercise. Spend less time sitting. Even light physical activity can be beneficial. Watch cholesterol and blood  lipids Have your blood tested for lipids and cholesterol at 63 years of age, then have this test every 5 years. Have your cholesterol levels checked more often if: Your lipid or cholesterol levels are high. You are older than 63 years of age. You are at high risk for heart disease. What should I know about cancer screening? Depending on your health history and family history, you may need to have cancer screening at various ages. This may include screening for: Breast cancer. Cervical cancer. Colorectal cancer. Skin cancer. Lung cancer. What should I know about heart disease, diabetes, and high blood pressure? Blood pressure and heart disease High blood pressure causes heart disease and increases the risk of stroke. This is more likely to develop in people who have high blood pressure readings or are overweight. Have your blood pressure checked: Every 3-5 years if you are 57-36 years of age. Every year if you are 70 years old or older. Diabetes Have regular diabetes screenings. This checks your fasting blood sugar level. Have the screening done: Once every three years after age 60 if you are at a normal weight and have a low risk for diabetes. More often and at a younger age if you are overweight or have a high risk for diabetes. What should I know about preventing infection? Hepatitis B If you have a higher risk for hepatitis B, you should be screened for this virus. Talk with your health care provider to find out if you are at risk for hepatitis B infection. Hepatitis C Testing is recommended for: Everyone born from [birth year] through 1965. Anyone with known risk factors for hepatitis C. Sexually transmitted infections (STIs) Get screened for STIs, including gonorrhea and chlamydia, if: You are sexually active and are younger than 63 years of age. You are older than 63 years of age and your health care provider tells you that you are at risk for this type of infection. Your sexual  activity has changed since you were last screened, and you are at increased risk for chlamydia or gonorrhea. Ask your health  care provider if you are at risk. Ask your health care provider about whether you are at high risk for HIV. Your health care provider may recommend a prescription medicine to help prevent HIV infection. If you choose to take medicine to prevent HIV, you should first get tested for HIV. You should then be tested every 3 months for as long as you are taking the medicine. Pregnancy If you are about to stop having your period (premenopausal) and you may become pregnant, seek counseling before you get pregnant. Take 400 to 800 micrograms (mcg) of folic acid every day if you become pregnant. Ask for birth control (contraception) if you want to prevent pregnancy. Osteoporosis and menopause Osteoporosis is a disease in which the bones lose minerals and strength with aging. This can result in bone fractures. If you are 71 years old or older, or if you are at risk for osteoporosis and fractures, ask your health care provider if you should: Be screened for bone loss. Take a calcium  or vitamin D supplement to lower your risk of fractures. Be given hormone replacement therapy (HRT) to treat symptoms of menopause. Follow these instructions at home: Alcohol use Do not drink alcohol if: Your health care provider tells you not to drink. You are pregnant, may be pregnant, or are planning to become pregnant. If you drink alcohol: Limit how much you have to: 0-1 drink a day. Know how much alcohol is in your drink. In the [place] one drink equals one 12 oz bottle of beer (355 mL), one 5 oz glass of wine (148 mL), or one 1 oz glass of hard liquor (44 mL). Lifestyle Do not use any products that contain nicotine or tobacco. These products include cigarettes, chewing tobacco, and vaping devices, such as e-cigarettes. If you need help quitting, ask your health care provider. Do not use street  drugs. Do not share needles. Ask your health care provider for help if you need support or information about quitting drugs. General instructions Schedule regular health, dental, and eye exams. Stay current with your vaccines. Tell your health care provider if: You often feel depressed. You have ever been abused or do not feel safe at home. Summary Adopting a healthy lifestyle and getting preventive care are important in promoting health and wellness. Follow your health care provider's instructions about healthy diet, exercising, and getting tested or screened for diseases. Follow your health care provider's instructions on monitoring your cholesterol and blood pressure. This information is not intended to replace advice given to you by your health care provider. Make sure you discuss any questions you have with your health care provider. Document Revised: 11/24/2020 Document Reviewed: 11/24/2020 Elsevier Patient Education  2024 Elsevier Inc.      Emil Schaumann, MD [facility]

## 2024-06-19 NOTE — Patient Instructions (Signed)

## 2024-06-19 NOTE — Assessment & Plan Note (Signed)
 BP Readings from Last 3 Encounters:  06/19/24 124/80  12/14/23 128/78  09/13/23 124/80  Well-controlled hypertension Continue Hyzaar 50-12.5 mg daily Cardiovascular risks associated with hypertension discussed Diet and nutrition discussed

## 2024-06-19 NOTE — Assessment & Plan Note (Addendum)
 Wt Readings from Last 3 Encounters:  06/19/24 200 lb (90.7 kg)  12/14/23 208 lb (94.3 kg)  09/13/23 213 lb (96.6 kg)  Eating better and losing weight successfully Continue Zepbound  10 mg weekly Benefits of exercise discussed Diet and nutrition discussed

## 2024-06-19 NOTE — Assessment & Plan Note (Signed)
Chronic stable condition Diet and nutrition discussed Continue rosuvastatin 10 mg daily

## 2024-06-20 ENCOUNTER — Other Ambulatory Visit: Payer: Self-pay | Admitting: Emergency Medicine

## 2024-06-20 DIAGNOSIS — Z6837 Body mass index (BMI) 37.0-37.9, adult: Secondary | ICD-10-CM

## 2024-06-20 NOTE — Telephone Encounter (Signed)
 Call pharmacy and request specific instructions on how to write out  this prescription.  I have no idea. Liposlim???

## 2024-06-21 ENCOUNTER — Telehealth: Payer: Self-pay | Admitting: Emergency Medicine

## 2024-06-21 MED ORDER — TIRZEPATIDE 10 MG/0.5ML ~~LOC~~ SOAJ: 10.0000 mg | SUBCUTANEOUS | 5 refills | Status: AC

## 2024-06-21 NOTE — Telephone Encounter (Unsigned)
 Copied from CRM #[id number]. Topic: Clinical - Medication Refill >> Jun 21, 2024  9:39 AM Zebedee SAUNDERS wrote: Medication: Liposlim  Has the patient contacted their pharmacy? Yes (Agent: If no, request that the patient contact the pharmacy for the refill. If patient does not wish to contact the pharmacy document the reason why and proceed with request.) (Agent: If yes, when and what did the pharmacy advise?)Pharmacy only provides the vial.   This is the patient's preferred pharmacy:   [facility] - [place], [place] - [address] [place] [address] [facility] [address] [place] [address] Phone: [phone] Fax: [phone]  Is this the correct pharmacy for this prescription? Yes If no, delete pharmacy and type the correct one.   Has the prescription been filled recently? No  Is the patient out of the medication? Yes  Has the patient been seen for an appointment in the last year OR does the patient have an upcoming appointment? Yes  Can we respond through MyChart? Yes  Agent: Please be advised that Rx refills may take up to 3 business days. We ask that you follow-up with your pharmacy.

## 2024-06-21 NOTE — Telephone Encounter (Signed)
 I have resent to the pharmacy and listed on the patient sig Liposlim compounded to see if this works

## 2024-06-22 ENCOUNTER — Other Ambulatory Visit: Payer: Self-pay

## 2024-06-22 DIAGNOSIS — Z6837 Body mass index (BMI) 37.0-37.9, adult: Secondary | ICD-10-CM

## 2024-06-22 MED ORDER — TIRZEPATIDE-WEIGHT MANAGEMENT 7.5 MG/0.5ML ~~LOC~~ SOLN: 7.5000 mg | SUBCUTANEOUS | 0 refills | Status: AC

## 2024-06-22 NOTE — Telephone Encounter (Signed)
This has been redone

## 2024-12-18 ENCOUNTER — Ambulatory Visit: Admitting: Emergency Medicine
